# Patient Record
Sex: Female | Born: 1945 | Race: White | Hispanic: No | Smoking: Former smoker
Health system: Southern US, Community
[De-identification: ages and names within clinical notes are randomized; demographics above are authoritative.]

## PROBLEM LIST (undated history)

## (undated) DIAGNOSIS — J45909 Unspecified asthma, uncomplicated: Secondary | ICD-10-CM

## (undated) DIAGNOSIS — E7800 Pure hypercholesterolemia, unspecified: Secondary | ICD-10-CM

## (undated) DIAGNOSIS — I872 Venous insufficiency (chronic) (peripheral): Secondary | ICD-10-CM

## (undated) DIAGNOSIS — R413 Other amnesia: Secondary | ICD-10-CM

## (undated) DIAGNOSIS — R42 Dizziness and giddiness: Secondary | ICD-10-CM

## (undated) DIAGNOSIS — M1990 Unspecified osteoarthritis, unspecified site: Secondary | ICD-10-CM

## (undated) DIAGNOSIS — K635 Polyp of colon: Secondary | ICD-10-CM

## (undated) DIAGNOSIS — F419 Anxiety disorder, unspecified: Secondary | ICD-10-CM

## (undated) DIAGNOSIS — R51 Headache: Secondary | ICD-10-CM

## (undated) DIAGNOSIS — M545 Low back pain, unspecified: Secondary | ICD-10-CM

## (undated) DIAGNOSIS — R55 Syncope and collapse: Secondary | ICD-10-CM

## (undated) DIAGNOSIS — I10 Essential (primary) hypertension: Secondary | ICD-10-CM

## (undated) HISTORY — DX: Essential (primary) hypertension: I10

## (undated) HISTORY — DX: Polyp of colon: K63.5

## (undated) HISTORY — PX: COLONOSCOPY: SHX174

## (undated) HISTORY — DX: Dizziness and giddiness: R42

## (undated) HISTORY — DX: Low back pain, unspecified: M54.50

## (undated) HISTORY — DX: Anxiety disorder, unspecified: F41.9

## (undated) HISTORY — PX: CARPAL TUNNEL RELEASE: SHX101

## (undated) HISTORY — DX: Other amnesia: R41.3

## (undated) HISTORY — DX: Pure hypercholesterolemia, unspecified: E78.00

## (undated) HISTORY — DX: Unspecified asthma, uncomplicated: J45.909

## (undated) HISTORY — DX: Syncope and collapse: R55

## (undated) HISTORY — DX: Headache: R51

## (undated) HISTORY — PX: POLYPECTOMY: SHX149

## (undated) HISTORY — DX: Unspecified osteoarthritis, unspecified site: M19.90

## (undated) HISTORY — DX: Venous insufficiency (chronic) (peripheral): I87.2

## (undated) HISTORY — DX: Morbid (severe) obesity due to excess calories: E66.01

## (undated) HISTORY — DX: Low back pain: M54.5

## (undated) HISTORY — PX: DILATION AND CURETTAGE OF UTERUS: SHX78

---

## 1968-08-01 HISTORY — PX: PELVIC ABCESS DRAINAGE: SHX2189

## 1995-08-02 HISTORY — PX: LUMBAR LAMINECTOMY: SHX95

## 1998-01-14 ENCOUNTER — Ambulatory Visit (HOSPITAL_COMMUNITY): Admission: RE | Admit: 1998-01-14 | Discharge: 1998-01-14 | Payer: Self-pay | Admitting: *Deleted

## 1999-08-24 ENCOUNTER — Encounter: Admission: RE | Admit: 1999-08-24 | Discharge: 1999-11-22 | Payer: Self-pay | Admitting: Pulmonary Disease

## 2000-08-27 ENCOUNTER — Emergency Department (HOSPITAL_COMMUNITY): Admission: EM | Admit: 2000-08-27 | Discharge: 2000-08-27 | Payer: Self-pay | Admitting: Emergency Medicine

## 2003-04-08 ENCOUNTER — Encounter: Payer: Self-pay | Admitting: Emergency Medicine

## 2003-04-08 ENCOUNTER — Emergency Department (HOSPITAL_COMMUNITY): Admission: EM | Admit: 2003-04-08 | Discharge: 2003-04-08 | Payer: Self-pay | Admitting: Emergency Medicine

## 2003-04-29 ENCOUNTER — Ambulatory Visit (HOSPITAL_COMMUNITY): Admission: RE | Admit: 2003-04-29 | Discharge: 2003-04-29 | Payer: Self-pay | Admitting: Cardiology

## 2003-04-29 ENCOUNTER — Encounter: Payer: Self-pay | Admitting: Cardiology

## 2004-03-01 ENCOUNTER — Ambulatory Visit (HOSPITAL_COMMUNITY): Admission: RE | Admit: 2004-03-01 | Discharge: 2004-03-01 | Payer: Self-pay | Admitting: Obstetrics and Gynecology

## 2004-03-01 ENCOUNTER — Encounter (INDEPENDENT_AMBULATORY_CARE_PROVIDER_SITE_OTHER): Payer: Self-pay | Admitting: *Deleted

## 2005-01-25 ENCOUNTER — Ambulatory Visit: Payer: Self-pay | Admitting: Pulmonary Disease

## 2005-07-15 ENCOUNTER — Emergency Department (HOSPITAL_COMMUNITY): Admission: EM | Admit: 2005-07-15 | Discharge: 2005-07-15 | Payer: Self-pay | Admitting: Family Medicine

## 2006-01-23 ENCOUNTER — Ambulatory Visit: Payer: Self-pay | Admitting: Pulmonary Disease

## 2007-01-24 ENCOUNTER — Ambulatory Visit: Payer: Self-pay | Admitting: Pulmonary Disease

## 2007-01-24 LAB — CONVERTED CEMR LAB
Basophils Relative: 0.5 % (ref 0.0–1.0)
CO2: 27 meq/L (ref 19–32)
Creatinine, Ser: 0.7 mg/dL (ref 0.4–1.2)
Direct LDL: 107.6 mg/dL
HCT: 37.9 % (ref 36.0–46.0)
Hemoglobin: 12.8 g/dL (ref 12.0–15.0)
Hgb A1c MFr Bld: 7.4 % — ABNORMAL HIGH (ref 4.6–6.0)
MCHC: 33.8 g/dL (ref 30.0–36.0)
Monocytes Absolute: 0.5 10*3/uL (ref 0.2–0.7)
Neutrophils Relative %: 64.1 % (ref 43.0–77.0)
Potassium: 3.4 meq/L — ABNORMAL LOW (ref 3.5–5.1)
RDW: 14.1 % (ref 11.5–14.6)
Sodium: 135 meq/L (ref 135–145)
Total Bilirubin: 0.8 mg/dL (ref 0.3–1.2)
Total CHOL/HDL Ratio: 3.4
Total Protein: 7.6 g/dL (ref 6.0–8.3)
VLDL: 41 mg/dL — ABNORMAL HIGH (ref 0–40)

## 2008-02-25 ENCOUNTER — Telehealth: Payer: Self-pay | Admitting: Pulmonary Disease

## 2008-02-29 ENCOUNTER — Ambulatory Visit: Payer: Self-pay | Admitting: Pulmonary Disease

## 2008-03-06 DIAGNOSIS — R519 Headache, unspecified: Secondary | ICD-10-CM | POA: Insufficient documentation

## 2008-03-06 DIAGNOSIS — E7800 Pure hypercholesterolemia, unspecified: Secondary | ICD-10-CM

## 2008-03-06 DIAGNOSIS — I10 Essential (primary) hypertension: Secondary | ICD-10-CM | POA: Insufficient documentation

## 2008-03-06 DIAGNOSIS — F411 Generalized anxiety disorder: Secondary | ICD-10-CM

## 2008-03-06 DIAGNOSIS — J209 Acute bronchitis, unspecified: Secondary | ICD-10-CM

## 2008-03-06 DIAGNOSIS — R51 Headache: Secondary | ICD-10-CM

## 2008-03-06 DIAGNOSIS — M545 Low back pain: Secondary | ICD-10-CM | POA: Insufficient documentation

## 2008-03-07 ENCOUNTER — Ambulatory Visit: Payer: Self-pay | Admitting: Pulmonary Disease

## 2008-03-08 LAB — CONVERTED CEMR LAB
ALT: 28 units/L (ref 0–35)
AST: 32 units/L (ref 0–37)
Albumin: 3.9 g/dL (ref 3.5–5.2)
Alkaline Phosphatase: 68 units/L (ref 39–117)
BUN: 12 mg/dL (ref 6–23)
CO2: 27 meq/L (ref 19–32)
Eosinophils Relative: 2.7 % (ref 0.0–5.0)
GFR calc Af Amer: 110 mL/min
Glucose, Bld: 138 mg/dL — ABNORMAL HIGH (ref 70–99)
HCT: 36.5 % (ref 36.0–46.0)
Hemoglobin: 12.7 g/dL (ref 12.0–15.0)
Monocytes Absolute: 0.4 10*3/uL (ref 0.1–1.0)
Monocytes Relative: 7.8 % (ref 3.0–12.0)
Platelets: 204 10*3/uL (ref 150–400)
Potassium: 4.4 meq/L (ref 3.5–5.1)
Total CHOL/HDL Ratio: 3.5
Total Protein: 7.2 g/dL (ref 6.0–8.3)
WBC: 5.1 10*3/uL (ref 4.5–10.5)

## 2008-04-08 ENCOUNTER — Encounter: Payer: Self-pay | Admitting: Pulmonary Disease

## 2008-05-30 ENCOUNTER — Ambulatory Visit: Payer: Self-pay | Admitting: Internal Medicine

## 2008-05-30 ENCOUNTER — Telehealth: Payer: Self-pay | Admitting: Pulmonary Disease

## 2008-06-17 ENCOUNTER — Ambulatory Visit: Payer: Self-pay | Admitting: Internal Medicine

## 2008-06-17 ENCOUNTER — Encounter: Payer: Self-pay | Admitting: Internal Medicine

## 2008-06-18 ENCOUNTER — Encounter: Payer: Self-pay | Admitting: Internal Medicine

## 2008-06-20 ENCOUNTER — Telehealth (INDEPENDENT_AMBULATORY_CARE_PROVIDER_SITE_OTHER): Payer: Self-pay | Admitting: *Deleted

## 2008-09-04 ENCOUNTER — Ambulatory Visit: Payer: Self-pay | Admitting: Pulmonary Disease

## 2009-05-11 ENCOUNTER — Telehealth: Payer: Self-pay | Admitting: Pulmonary Disease

## 2009-05-15 ENCOUNTER — Ambulatory Visit: Payer: Self-pay | Admitting: Pulmonary Disease

## 2009-05-20 ENCOUNTER — Ambulatory Visit: Payer: Self-pay | Admitting: Pulmonary Disease

## 2009-05-21 DIAGNOSIS — M1990 Unspecified osteoarthritis, unspecified site: Secondary | ICD-10-CM

## 2009-05-21 DIAGNOSIS — D126 Benign neoplasm of colon, unspecified: Secondary | ICD-10-CM

## 2009-05-21 LAB — CONVERTED CEMR LAB
ALT: 38 units/L — ABNORMAL HIGH (ref 0–35)
AST: 39 units/L — ABNORMAL HIGH (ref 0–37)
Albumin: 4 g/dL (ref 3.5–5.2)
Alkaline Phosphatase: 60 units/L (ref 39–117)
BUN: 11 mg/dL (ref 6–23)
Basophils Absolute: 0 10*3/uL (ref 0.0–0.1)
Basophils Relative: 0.6 % (ref 0.0–3.0)
Bilirubin, Direct: 0 mg/dL (ref 0.0–0.3)
CO2: 28 meq/L (ref 19–32)
Calcium: 9.3 mg/dL (ref 8.4–10.5)
Chloride: 94 meq/L — ABNORMAL LOW (ref 96–112)
Cholesterol: 178 mg/dL (ref 0–200)
Creatinine, Ser: 0.6 mg/dL (ref 0.4–1.2)
Eosinophils Absolute: 0.1 10*3/uL (ref 0.0–0.7)
Eosinophils Relative: 1.8 % (ref 0.0–5.0)
GFR calc non Af Amer: 107.89 mL/min (ref >60)
Glucose, Bld: 131 mg/dL — ABNORMAL HIGH (ref 70–99)
HCT: 35.6 % — ABNORMAL LOW (ref 36.0–46.0)
HDL: 51 mg/dL (ref >39.00)
Hemoglobin: 12.4 g/dL (ref 12.0–15.0)
Hgb A1c MFr Bld: 6.6 % — ABNORMAL HIGH (ref 4.6–6.5)
LDL Cholesterol: 89 mg/dL (ref 0–99)
Lymphocytes Relative: 27.6 % (ref 12.0–46.0)
Lymphs Abs: 1.5 10*3/uL (ref 0.7–4.0)
MCHC: 34.7 g/dL (ref 30.0–36.0)
MCV: 87.8 fL (ref 78.0–100.0)
Monocytes Absolute: 0.5 10*3/uL (ref 0.1–1.0)
Monocytes Relative: 8.5 % (ref 3.0–12.0)
Neutro Abs: 3.5 10*3/uL (ref 1.4–7.7)
Neutrophils Relative %: 61.5 % (ref 43.0–77.0)
Platelets: 216 10*3/uL (ref 150.0–400.0)
Potassium: 4.1 meq/L (ref 3.5–5.1)
RBC: 4.06 M/uL (ref 3.87–5.11)
RDW: 13.2 % (ref 11.5–14.6)
Sodium: 133 meq/L — ABNORMAL LOW (ref 135–145)
TSH: 0.83 microintl units/mL (ref 0.35–5.50)
Total Bilirubin: 0.7 mg/dL (ref 0.3–1.2)
Total CHOL/HDL Ratio: 3
Total Protein: 7.3 g/dL (ref 6.0–8.3)
Triglycerides: 190 mg/dL — ABNORMAL HIGH (ref 0.0–149.0)
VLDL: 38 mg/dL (ref 0.0–40.0)
WBC: 5.6 10*3/uL (ref 4.5–10.5)

## 2010-05-10 ENCOUNTER — Telehealth (INDEPENDENT_AMBULATORY_CARE_PROVIDER_SITE_OTHER): Payer: Self-pay | Admitting: *Deleted

## 2010-05-17 ENCOUNTER — Ambulatory Visit: Payer: Self-pay | Admitting: Pulmonary Disease

## 2010-05-21 ENCOUNTER — Encounter: Payer: Self-pay | Admitting: Pulmonary Disease

## 2010-05-21 ENCOUNTER — Ambulatory Visit: Payer: Self-pay | Admitting: Pulmonary Disease

## 2010-05-24 DIAGNOSIS — I872 Venous insufficiency (chronic) (peripheral): Secondary | ICD-10-CM

## 2010-05-24 LAB — CONVERTED CEMR LAB
Basophils Absolute: 0 10*3/uL (ref 0.0–0.1)
Bilirubin, Direct: 0 mg/dL (ref 0.0–0.3)
CO2: 27 meq/L (ref 19–32)
Calcium: 9.8 mg/dL (ref 8.4–10.5)
Cholesterol: 197 mg/dL (ref 0–200)
Creatinine, Ser: 0.5 mg/dL (ref 0.4–1.2)
Direct LDL: 119.8 mg/dL
Eosinophils Absolute: 0.1 10*3/uL (ref 0.0–0.7)
Glucose, Bld: 123 mg/dL — ABNORMAL HIGH (ref 70–99)
Lymphocytes Relative: 20.4 % (ref 12.0–46.0)
MCHC: 34.4 g/dL (ref 30.0–36.0)
Neutrophils Relative %: 70.3 % (ref 43.0–77.0)
RBC: 3.84 M/uL — ABNORMAL LOW (ref 3.87–5.11)
RDW: 14.7 % — ABNORMAL HIGH (ref 11.5–14.6)
TSH: 0.52 microintl units/mL (ref 0.35–5.50)
Total Bilirubin: 0.5 mg/dL (ref 0.3–1.2)
Total CHOL/HDL Ratio: 3
Triglycerides: 213 mg/dL — ABNORMAL HIGH (ref 0.0–149.0)
Vit D, 25-Hydroxy: 36 ng/mL (ref 30–89)

## 2010-08-31 NOTE — Progress Notes (Signed)
Summary: fasting labs---order in idx  Phone Note Call from Patient Call back at Work Phone [phone]   Caller: pt Call For: Cynthia [name] Summary of Call: Pt has physical schduled for 05-21-10 at 9am. Pt wants to come by earlier in the week for her fasting labs. Pt request a call back after this has been done. Please advise on what labs to order. Thaks. Carron Curie CMA  May 10, 2010 11:00 AM   Follow-up for Phone Call        per SN---ok for pt to come in for labs----flp,bmp,hepat, cbcd, tsh--v70.0   a1c-250.00 and vit d-733.00.  thanks Randell Loop CMA  May 10, 2010 11:52 AM   called and spoke with pt.  pt aware sn ok'd labs prior to appt. pt would like to come in monday 05/17/2010 for labwork.  labs in idx.  pt aware.  Aundra Millet Reynolds LPN  May 10, 2010 1:10 PM

## 2010-08-31 NOTE — Assessment & Plan Note (Signed)
Summary: 1 yr physical ///kp   CC:  Yearly ROV & CPX....  History of Present Illness: 65 y/o WF here for a follow up visit... she has multiple medical problems as noted below...    ~  May 20, 2009:  63 WF here for preventive health visit- hx HBP, Chol, DM, Obesity... weight up 11# this yr to 248#, 5'2" tall, BMI= 45... not motivated to diet/ exercise/ etc & we discussed this in detail... she has no new complaints or concerns, requesting refill meds for 90d supplies...   ~  May 21, 2010:  she's had a good yr- no new complaints or concerns... unfortunately Safiatou has not lost any weight over the past yr & not really dieting or exercising... BP controlled on meds;  Chol looks good on Simva20;  DM remains controlled on oral agents;  we discussed diet + exercise needed to effect weight reduction... she requests 90d refills + flu shot...    Current Problem List:  ASTHMATIC BRONCHITIS, ACUTE (ICD-466.0) - no recent exas...  HYPERTENSION (ICD-401.9) - on ASA 81mg /d,  LISINOPRIL 20mg /d,  HCTZ 25mg /d,  & K20/d... BP= 136/72 today and really not checking at home... denies HA, visual changes, CP, palipit, dizziness, syncope, edema... no change in chr DOE (poor conditioning).  ~  baseline EKG w/ poor R progression & NSSTTWA...  ~  Cath in 9/04 was normal- norm coronaries, norm LVF, etc... rec for aggressive primary prevention.  VENOUS INSUFFICIENCY (ICD-459.81) - she has mild VI & 1+edema... on low sodium diet, elevation, support hose...  HYPERCHOLESTEROLEMIA (ICD-272.0) - on diet + SIMVASTATIN 20mg /d...  ~  FLP 6/08 showed TChol 193, TG 205, HDL 56, LDL 108...   ~  FLP 7/09 showed TChol 176, TG 161, HDL 50, LDL 94... keep same med, better diet effort...  ~  FLP 10/10 showed TChol 178, TG 190, HDL 51, LDL 89  ~  FLP 10/11 showed TChol 197, TG 213, HDL 58, LDL 43  DIABETES MELLITUS (ICD-250.00) - on diet + METFORMIN 500mg Bid,  ACTOS 15mg /d (30mg tabs- 1/2)...   ~  labs 6/09 showed BS= 144,  HgA1c= 7.4... she was on Metformin once daily- incr to Bid...  ~  labs 7/09 showed BS= 138, HgA1c= 6.4.Marland KitchenMarland Kitchen rec better diet & get weight down...  ~  labs 10/10 showed BS= 131, A1c= 6.6.Marland Kitchen. rec> same meds, better diet, get wt down!  ~  labs 10/11 showed BS= 123, A1c= 6.6  MORBID OBESITY (ICD-278.01) -   ~  8/09: weight = 237# which is down 6# since 2008... discussed diet + exercise...  ~  10/10: weight = 248#... reviewed diet + exercise perscription for wt reduction...  ~  10/11:  weight = 248#  COLONIC POLYPS (ICD-211.3) - pt finally had routine screening colonoscopy 11/09 w/ 3 adenomatous polyps removed by DrDBrodie... f/u planned 69yrs...  BACK PAIN, LUMBAR (ICD-724.2) - s/o lumbar lam in 1977...  HEADACHE (ICD-784.0) - hx dizziness secondary to hyperventilation in past w/ eval from Neurology...  ANXIETY DISORDER (ICD-300.00) - on ALPRAZOLAM 0.5mg  Prn...   Preventive Screening-Counseling & Management  Alcohol-Tobacco     Smoking Status: quit     Year Quit: 1980  Allergies (verified): No Known Drug Allergies  Comments:  Nurse/Medical Assistant: The patient's medications and allergies were reviewed with the patient and were updated in the Medication and Allergy Lists.  Past History:  Past Medical History: ASTHMATIC BRONCHITIS, ACUTE (ICD-466.0) HYPERTENSION (ICD-401.9) VENOUS INSUFFICIENCY (ICD-459.81) HYPERCHOLESTEROLEMIA (ICD-272.0) DIABETES MELLITUS (ICD-250.00) MORBID OBESITY (ICD-278.01) COLONIC POLYPS (  ICD-211.3) DEGENERATIVE JOINT DISEASE (ICD-715.90) BACK PAIN, LUMBAR (ICD-724.2) HEADACHE (ICD-784.0) ANXIETY DISORDER (ICD-300.00)  Past Surgical History: S/P drainage of pelvic abscess by DrMLeBauer in 1970 S/P lumbar laminectomy 1977 by DrRobinson  Family History: Reviewed history from 03/07/2008 and no changes required. mother deceased age 93 from heart problems father deceased age 27 from heart problems 1 sibling alive age 39  hx of heart bypass and DM 1  sibling alive age 57 hx of DM  Social History: Reviewed history from 05/20/2009 and no changes required. 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  The patient denies fever, chills, sweats, anorexia, weakness, malaise, weight loss, sleep disorder, blurring, diplopia, eye irritation, eye discharge, vision loss, eye pain, photophobia, earache, ear discharge, tinnitus, decreased hearing, nasal congestion, nosebleeds, sore throat, hoarseness, chest pain, palpitations, syncope, orthopnea, PND, peripheral edema, cough, dyspnea at rest, excessive sputum, hemoptysis, wheezing, pleurisy, nausea, vomiting, diarrhea, constipation, change in bowel habits, abdominal pain, melena, hematochezia, jaundice, gas/bloating, indigestion/heartburn, dysphagia, odynophagia, dysuria, hematuria, urinary frequency, urinary hesitancy, nocturia, incontinence, back pain, joint pain, joint swelling, muscle cramps, muscle weakness, sciatica, restless legs, leg pain at night, leg pain with exertion, rash, itching, dryness, suspicious lesions, paralysis, paresthesias, seizures, tremors, vertigo, transient blindness, frequent falls, frequent headaches, difficulty walking, depression, anxiety, memory loss, confusion, cold intolerance, heat intolerance, polydipsia, polyphagia, polyuria, unusual weight change, abnormal bruising, bleeding, enlarged lymph nodes, urticaria, allergic rash, hay fever, and recurrent infections.    Vital Signs:  Patient profile:   65 year old female Height:      62 inches Weight:      247.50 pounds BMI:     45.43 O2 Sat:      97 % on Room air Temp:     97.9 degrees F oral Pulse rate:   87 / minute BP sitting:   136 / 72  (right arm) Cuff size:   large  Vitals Entered By: Randell Loop CMA  (May 21, 2010 8:52 AM)  O2 Sat at Rest %:  97 O2 Flow:  Room air CC: Yearly ROV & CPX... Is Patient Diabetic? Yes Pain Assessment Patient in pain? no      Comments NO CHANGES IN MEDS TODAY   Physical Exam  Additional Exam:  WD, Obese, 65 y/o WF in NAD... GENERAL:  Alert & oriented; pleasant & cooperative... HEENT:  [place]/AT, EOM-wnl, PERRLA, EACs-clear, TMs-wnl, NOSE-clear, THROAT-clear & wnl. NECK:  Supple w/ fairROM; no JVD; normal carotid impulses w/o bruits; no thyromegaly or nodules palpated; no lymphadenopathy. CHEST:  Clear to P & A; without wheezes/ rales/ or rhonchi. HEART:  Regular Rhythm; without murmurs/ rubs/ or gallops. ABDOMEN:  Obese, soft & nontender w/ panniculus; normal bowel sounds; no organomegaly or masses detected.  EXT: without deformities, mild arthritic changes; no varicose veins/ +venous insuffic/ tr edema... NEURO:  CN's intact;  no focal neuro deficits... DERM:  no rash, no lesions noted...    CXR  Procedure date:  05/21/2010  Findings:      CHEST - 2 VIEW Comparison: None.   Findings: Two views of the chest were obtained.  Lungs clear without focal airspace disease or pulmonary edema.  Heart and mediastinum are within normal limits.  Trachea is midline.  Osseous structures are intact.   IMPRESSION: Normal chest examination.  No acute findings.   Read By:  Abundio Miu,  M.D.   EKG  Procedure date:  05/21/2010  Findings:      Normal sinus rhythm with rate of:  90/ min... Tracing shows NSSTTWA... SN   MISC. Report  Procedure date:  05/17/2010  Findings:      Lipid Panel (LIPID)   Cholesterol               197 mg/dL                   1-610   Triglycerides        [H]  213.0 mg/dL                 9.6-045.4   HDL                       09.81 mg/dL                 >19.14  Cholesterol LDL - Direct                             119.8 mg/dL  BMP (METABOL)   Sodium               [L]  133 mEq/L                   135-145    Potassium                 4.7 mEq/L                   3.5-5.1   Chloride             [L]  95 mEq/L                    96-112   Carbon Dioxide            27 mEq/L                    19-32   Glucose              [H]  123 mg/dL                   78-29   BUN                       14 mg/dL                    5-62   Creatinine                0.5 mg/dL                   1.3-0.8   Calcium                   9.8 mg/dL                   6.5-78.4   GFR                       129.72 mL/min               >60   CBC Platelet w/Diff (CBCD)   White Cell Count          7.0 K/uL  4.5-10.5   Red Cell Count       [L]  3.84 Mil/uL                 3.87-5.11   Hemoglobin           [L]  11.7 g/dL                   16.1-09.6   Hematocrit           [L]  34.2 %                      36.0-46.0   MCV                       88.9 fl                     78.0-100.0  Platelet Count            203.0 K/uL                  150.0-400.0   Neutrophil %              70.3 %                      43.0-77.0   Lymphocyte %              20.4 %                      12.0-46.0   Monocyte %                7.3 %                       3.0-12.0   Eosinophils%              1.6 %                       0.0-5.0   Basophils %               0.4 %    Comments:      TSH (TSH)   FastTSH                   0.52 uIU/mL                 0.35-5.50  Hepatic/Liver Function Panel (HEPATIC)   Total Bilirubin           0.5 mg/dL                   0.4-5.4   Direct Bilirubin          0.0 mg/dL                   0.9-8.1   Alkaline Phosphatase      55 U/L                      39-117   AST                       33 U/L                      0-37   ALT  27 U/L                      0-35   Total Protein             6.7 g/dL                    0.9-8.1   Albumin                   4.0 g/dL                    1.9-1.4  Hemoglobin A1C (A1C)   Hemoglobin A1C       [H]  6.6 %                       4.6-6.5   Impression &  Recommendations:  Problem # 1:  PHYSICAL EXAMINATION (ICD-V70.0) Weight reduction is of the utmost importance... Orders: T-2 View CXR (71020TC) 12 Lead EKG (12 Lead EKG)  Problem # 2:  HYPERTENSION (ICD-401.9) Controlled>  same meds. Her updated medication list for this problem includes:    Lisinopril 20 Mg Tabs (Lisinopril) .Marland Kitchen... Take 1 tab by mouth once daily...    Hydrochlorothiazide 25 Mg Tabs (Hydrochlorothiazide) .Marland Kitchen... Take 1 tablet by mouth once a day  Problem # 3:  VENOUS INSUFFICIENCY (ICD-459.81) No salt, elevation, support hose, etc...  Problem # 4:  HYPERCHOLESTEROLEMIA (ICD-272.0) FLP not as good this yr on her Simva20... may need to go to 40mg /d but she really needs to lose wt! Her updated medication list for this problem includes:    Simvastatin 20 Mg Tabs (Simvastatin) .Marland Kitchen... Take 1 tab by mouth at bedtime...  Problem # 5:  DIABETES MELLITUS (ICD-250.00) DM control is adeq... diet + exercise are imperitive... Her updated medication list for this problem includes:    Adult Aspirin Low Strength 81 Mg Tbdp (Aspirin) .Marland Kitchen... Take 1 tablet by mouth once a day    Lisinopril 20 Mg Tabs (Lisinopril) .Marland Kitchen... Take 1 tab by mouth once daily...    Metformin Hcl 500 Mg Tabs (Metformin hcl) .Marland Kitchen... Take 1 tab by mouth two times a day    Actos 30 Mg Tabs (Pioglitazone hcl) .Marland Kitchen... Take as directed...  Problem # 6:  MORBID OBESITY (ICD-278.01) We reviewed diet + exercise strategies that should aide weight reduction...  Problem # 7:  OTHER MEDICAL PROBLEMS AS NOTED>>> OK Flu shot...  Complete Medication List: 1)  Adult Aspirin Low Strength 81 Mg Tbdp (Aspirin) .... Take 1 tablet by mouth once a day 2)  Lisinopril 20 Mg Tabs (Lisinopril) .... Take 1 tab by mouth once daily.Marland KitchenMarland Kitchen 3)  Hydrochlorothiazide 25 Mg Tabs (Hydrochlorothiazide) .... Take 1 tablet by mouth once a day 4)  Klor-con M20 20 Meq Cr-tabs (Potassium chloride crys cr) .... Take 1 tablet by mouth once a day 5)  Simvastatin 20  Mg Tabs (Simvastatin) .... Take 1 tab by mouth at bedtime.Marland KitchenMarland Kitchen 6)  Metformin Hcl 500 Mg Tabs (Metformin hcl) .... Take 1 tab by mouth two times a day 7)  Actos 30 Mg Tabs (Pioglitazone hcl) .... Take as directed... 8)  Alprazolam 0.5 Mg Tabs (Alprazolam) .... Take 1/2 to 1 tab by mouth three times a day as needed for nerves.Marland KitchenMarland Kitchen 9)  Womens Multivitamin Plus Tabs (Multiple vitamins-minerals) .... Take 1 tab by mouth once daily... 10)  Ferrous Sulfate 325 (65 Fe) Mg Tabs (Ferrous sulfate) .... Take 1 tab by mouth  once daily...  Other Orders: Admin 1st Vaccine (95188) Flu Vaccine 63yrs + [phone])  Patient Instructions: 1)  Today we updated your med list- see below.... 2)  We refilled your meds for 90d supplies.Marland KitchenMarland Kitchen 3)  We also discussed taking a Women's Multivit w/ at least 1000mg  Vit D in it; plus an iron supplement like Ferrous Sulfate or Feosol daily.Marland KitchenMarland Kitchen 4)  Today we did your follow up CXR & EKG- please call the "phone tree" in a few days for your results.Marland KitchenMarland Kitchen  5)  We reviewed your labs & they are stable... let's get on track w/ our diet & exercise program w/ a goal of losing 15-20 lbs over the next few months... 6)  We gave you the 2011 Flu vaccine today... 7)  Call for any questions... Prescriptions: ALPRAZOLAM 0.5 MG  TABS (ALPRAZOLAM) take 1/2 to 1 tab by mouth three times a day as needed for nerves...  #100 x 6   Entered and Authorized by:   Michele Mcalpine MD   Signed by:   Michele Mcalpine MD on 05/21/2010   Method used:   Print then Give to Patient   RxID:   [id number] ACTOS 30 MG TABS (PIOGLITAZONE HCL) take as directed...  #90 x 4   Entered and Authorized by:   Michele Mcalpine MD   Signed by:   Michele Mcalpine MD on 05/21/2010   Method used:   Print then Give to Patient   RxID:   [id number] METFORMIN HCL 500 MG  TABS (METFORMIN HCL) take 1 tab by mouth two times a day  #180 x 4   Entered and Authorized by:   Michele Mcalpine MD   Signed by:   Michele Mcalpine MD on 05/21/2010   Method  used:   Print then Give to Patient   RxID:   [id number] SIMVASTATIN 20 MG  TABS (SIMVASTATIN) take 1 tab by mouth at bedtime...  #90 x 4   Entered and Authorized by:   Michele Mcalpine MD   Signed by:   Michele Mcalpine MD on 05/21/2010   Method used:   Print then Give to Patient   RxID:   [id number] KLOR-CON M20 20 MEQ  CR-TABS (POTASSIUM CHLORIDE CRYS CR) Take 1 tablet by mouth once a day  #90 x 4   Entered and Authorized by:   Michele Mcalpine MD   Signed by:   Michele Mcalpine MD on 05/21/2010   Method used:   Print then Give to Patient   RxID:   [id number] HYDROCHLOROTHIAZIDE 25 MG  TABS (HYDROCHLOROTHIAZIDE) Take 1 tablet by mouth once a day  #90 x 4   Entered and Authorized by:   Michele Mcalpine MD   Signed by:   Michele Mcalpine MD on 05/21/2010   Method used:   Print then Give to Patient   RxID:   [id number] LISINOPRIL 20 MG  TABS (LISINOPRIL) take 1 tab by mouth once daily...  #90 x 4   Entered and Authorized by:   Michele Mcalpine MD   Signed by:   Michele Mcalpine MD on 05/21/2010   Method used:   Print then Give to Patient   RxID:   [id number]    Immunization History:  Influenza Immunization History:    Influenza:  historical (05/20/2009) Flu Vaccine Consent Questions     Do you have a history of severe allergic reactions to this vaccine? no    Any prior history  of allergic reactions to egg and/or gelatin? no    Do you have a sensitivity to the preservative Thimersol? no    Do you have a past history of Guillan-Barre Syndrome? no    Do you currently have an acute febrile illness? no    Have you ever had a severe reaction to latex? no    Vaccine information given and explained to patient? yes    Are you currently pregnant? no    Lot Number:[id number]   Exp Date:01/29/2011   Site Given  Left Deltoid [id number]   Randell Loop CMA  May 21, 2010 11:00 AM

## 2010-12-17 NOTE — Cardiovascular Report (Signed)
NAME:  Cynthia [name], Cynthia [name] NO.:  [id number]   MEDICAL RECORD NO.:  [id number]                   PATIENT TYPE:  OIB   LOCATION:  2867                                 FACILITY:  [facility]   PHYSICIAN:  Carole Binning, M.D. [facility]         DATE OF BIRTH:  Sep 25, 1945   DATE OF PROCEDURE:  04/29/2003  DATE OF DISCHARGE:  04/29/2003                              CARDIAC CATHETERIZATION   PROCEDURE PERFORMED:  Right and left heart catheterization with coronary  angiography and left ventriculography.   INDICATIONS:  [name] is a 65 year old woman with multiple cardiac risk  factors.  She has been having progressive exertional dyspnea and occasional  chest discomfort.  She was evaluated by Dr. Tenny Craw and referred for right and  left heart catheterization to evaluate her hemodynamics and rule out  coronary artery disease.   PROCEDURAL NOTE:  An 8 French sheath was placed in the right femoral vein, a  6 French sheath in the right femoral artery.  Right heart catheterization  was performed with a Swan-Ganz catheter.  Left ventriculography was  performed with an angled pigtail catheter.  Coronary angiography was  performed with 6 [place] JL4 and JR4 catheters.  Contrast with Omnipaque.  There were no complications.   RESULTS:   HEMODYNAMICS:  1. Right atrial mean pressure:  11.  2. Right ventricular pressure:  32/10.  3. Pulmonary artery pressure:  32/16.  4. Pulmonary capillary wedge mean pressure:  13.  5. Left ventricular pressure:  104/16.  6. Aortic pressure:  102/56.  7. There was no aortic valve gradient.  8. Cardiac output by the thermodilution method is 5.7, cardiac index 2.7.     Cardiac output measured by the Fick method is 3.7, cardiac index 1.8.     The thermodilution cardiac output appears to be the more accurate of the     two.   LEFT VENTRICULOGRAM:  Wall motion is normal.  Ejection fraction estimated at  greater than or equal to 65%.  There is no  mitral regurgitation.   CORONARY ARTERIOGRAPHY (RIGHT-DOMINANT):  1. Left main is very short but free of disease.  2. Left anterior descending artery gives rise to a small first diagonal and     normal-sized second and third diagonal branches.  The LAD is normal.  3. Left circumflex gives rise to a single large branching obtuse marginal.     The left circumflex is normal.  4. Right coronary artery was a dominant vessel.  There was catheter-induced     spasm in the proximal right coronary artery, which was relieved with     intracoronary nitroglycerin.  The distal right coronary artery gives rise     to a normal-sized posterior descending artery and two small to normal-     sized posterolateral branches.   IMPRESSION:  1. Normal right and left heart filling pressures with normal pulmonary  artery pressure.  2. Normal left ventricular systolic function.  3. Normal coronary arteries.   PLAN:  The patient will be managed medically.  It is recommended that  alternative etiologies for her symptoms be evaluated.                                                Carole Binning, M.D. [facility]    MWP/MEDQ  D:  04/29/2003  T:  04/30/2003  Job:  [id number]   cc:   Lonzo Cloud. Kriste Basque, M.D. [facility]   Pricilla Riffle, M.D.   Cardiac Catheterization Lab

## 2010-12-17 NOTE — H&P (Signed)
NAME:  Cynthia [name], Cynthia [name] NO.:  [id number]   MEDICAL RECORD NO.:  [id number]                   PATIENT TYPE:   LOCATION:                                       FACILITY:   PHYSICIAN:  Rudy Jew. Ashley Royalty, M.D.             DATE OF BIRTH:   DATE OF ADMISSION:  03/01/2004  DATE OF DISCHARGE:                                HISTORY & PHYSICAL   HISTORY:  A 65 year old nulla gravida who presented to me on February 06, 2004  for her annual examination.  At the time of the visit, she was unable to  tolerate a pelvic examination due to inability to tolerate having the  speculum opened in her vagina.  Hence she is brought in for examination  under anesthesia with pap smear and possible dilatation and curettage.  She  is some 7 years post menopausal.  She has occasional hot flashes but no  vaginal dryness.  She has had no pap or pelvic examination in several years.   MEDICATIONS:  1. Zestril.  2. Glucophage XR.  3. Actos.  4. Lipitor.  5. Hydrochlorothiazide.  6. Alprazolam.   PAST MEDICAL HISTORY:  1. Diabetes.  2. Hypertension.  3. Hypercholesterolemia.   PAST SURGICAL HISTORY:  1. EAVW-0981.  2. Dilatation and curretage-1988 and 1990.   ALLERGIES:  None.   FAMILY HISTORY:  Positive for hypertension and diabetes.   SOCIAL HISTORY:  She denies tobacco or significant alcohol.   REVIEW OF SYSTEMS:  Noncontributory.   PHYSICAL EXAMINATION:  GENERAL:  A well-developed well-nourished pleasant  female in acute distress.  VITAL SIGNS:  Stable.  SKIN:  Warm and dry with no lesions.  LYMPH NODES:  There is no supraclavicular, cervical, or inguinal adenopathy.  HEENT:  Normocephalic.  NECK:  Supple without thyromegaly.  LUNGS:  Clear.  CARDIAC:  Regular rate and rhythm without murmurs, rubs, or gallops.  BREASTS:  Without palpable masses, discharge, or axillary adenopathy.  ABDOMEN:  Soft, nontender, without masses or organomegaly.  Bowel sounds are   adequate.  MUSCULOSKELETAL:  Reveals full range of motion without edema, cyanosis or  CVA tenderness.  PELVIC:  External genitalia within normal limits.  Speculum examination was  not tolerated as mentioned above.   IMPRESSION:  1. Climacteric.  2. Obesity.  3. Hypertension.  4. Hypercholesterolemia.  5. Diabetes mellitus.  6. Inability to tolerate the pelvic examination in office setting.   PLAN:  Examination under anesthesia with pap smear, pelvic examination, and  possible dilatation and curettage.  The risks and benefits of the above were  discussed with the patient.  She states she understands and accepts.  All  questions were answered.  James A. Ashley Royalty, M.D.    JAM/MEDQ  D:  03/01/2004  T:  03/01/2004  Job:  [id number]

## 2010-12-17 NOTE — Op Note (Signed)
NAME:  Cynthia [name], Cynthia [name]                            ACCOUNT NO.:  [id number]   MEDICAL RECORD NO.:  [id number]                   PATIENT TYPE:  AMB   LOCATION:  SDC                                  FACILITY:  [facility]   PHYSICIAN:  James A. Ashley Royalty, M.D.             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   OPERATION PERFORMED:  1. Examination under anesthesia.  2. Pap smear.   SURGEON:  Rudy Jew. Ashley Royalty, M.D.   ANESTHESIA:  Monitored anesthesia care with paracervical block (1%  Xylocaine).   ESTIMATED BLOOD LOSS:  Less than 10 mL.   COMPLICATIONS:  None.   PACKS AND DRAINS:  None.   DESCRIPTION OF PROCEDURE:  The patient was taken to the operating room and  placed in the dorsal supine position.  IV sedation was administered and  speculum was placed per vagina.  The patient required additional anesthetic  agent in order to allow placement of the speculum.  She was still quite  uncomfortable and the decision was made to proceed with a paracervical  block.  The cervix was cleansed with Betadine and approximately 10 mL of  Xylocaine were instilled into the anterior lip in order to grasp it with a  tenaculum successfully.  The cervix was then pulled into view with gentle  traction on the tenaculum.  Pap smear was successfully performed including  an endocervical and ectocervical component.  At this point the vaginal  instruments were removed.   Bimanual examination was then performed.  The uterus was felt to be normal  size, shape and contour.  It appeared to be midposition.  I could appreciate  no adnexal masses.  Rectovaginal examination confirmed.  The examination was  compromised slightly by  the patient's obesity but no abnormalities were  felt.   Hemostasis was noted and the patient was taken to recovery room in excellent  condition.                                               James A. Ashley Royalty, M.D.    JAM/MEDQ  D:  03/01/2004  T:  03/01/2004  Job:  [id number]

## 2011-05-03 LAB — GLUCOSE, CAPILLARY: Glucose-Capillary: 120 — ABNORMAL HIGH

## 2011-05-16 ENCOUNTER — Telehealth: Payer: Self-pay | Admitting: Pulmonary Disease

## 2011-05-16 DIAGNOSIS — E119 Type 2 diabetes mellitus without complications: Secondary | ICD-10-CM

## 2011-05-16 DIAGNOSIS — E7800 Pure hypercholesterolemia, unspecified: Secondary | ICD-10-CM

## 2011-05-16 DIAGNOSIS — M1990 Unspecified osteoarthritis, unspecified site: Secondary | ICD-10-CM

## 2011-05-16 DIAGNOSIS — I10 Essential (primary) hypertension: Secondary | ICD-10-CM

## 2011-05-16 NOTE — Telephone Encounter (Signed)
Per SN-okay to place lab order for Lipid, CBC-diff, BMET, Hep panel, TSH, Vit D, HgA1C. Pt is aware that labs are ordered.

## 2011-05-16 NOTE — Telephone Encounter (Signed)
IPt is requesting to have labs done before her 10/25 apt. Please advise what labs pt will need Dr. Kriste Basque, thanks  Carver Fila, CMA

## 2011-05-19 ENCOUNTER — Other Ambulatory Visit: Payer: Self-pay | Admitting: Pulmonary Disease

## 2011-05-19 ENCOUNTER — Other Ambulatory Visit (INDEPENDENT_AMBULATORY_CARE_PROVIDER_SITE_OTHER): Payer: Self-pay

## 2011-05-19 DIAGNOSIS — M1990 Unspecified osteoarthritis, unspecified site: Secondary | ICD-10-CM

## 2011-05-19 DIAGNOSIS — E119 Type 2 diabetes mellitus without complications: Secondary | ICD-10-CM

## 2011-05-19 DIAGNOSIS — I10 Essential (primary) hypertension: Secondary | ICD-10-CM

## 2011-05-19 DIAGNOSIS — E7800 Pure hypercholesterolemia, unspecified: Secondary | ICD-10-CM

## 2011-05-19 LAB — BASIC METABOLIC PANEL
BUN: 12 mg/dL (ref 6–23)
CO2: 28 mEq/L (ref 19–32)
Calcium: 9.5 mg/dL (ref 8.4–10.5)
Creatinine, Ser: 0.6 mg/dL (ref 0.4–1.2)

## 2011-05-19 LAB — CBC WITH DIFFERENTIAL/PLATELET
Basophils Absolute: 0 10*3/uL (ref 0.0–0.1)
Eosinophils Absolute: 0.1 10*3/uL (ref 0.0–0.7)
HCT: 37.7 % (ref 36.0–46.0)
Hemoglobin: 12.8 g/dL (ref 12.0–15.0)
Lymphs Abs: 1.1 10*3/uL (ref 0.7–4.0)
MCHC: 33.9 g/dL (ref 30.0–36.0)
Monocytes Relative: 8 % (ref 3.0–12.0)
Neutro Abs: 4.3 10*3/uL (ref 1.4–7.7)
RDW: 14.8 % — ABNORMAL HIGH (ref 11.5–14.6)

## 2011-05-19 LAB — HEPATIC FUNCTION PANEL
Bilirubin, Direct: 0.1 mg/dL (ref 0.0–0.3)
Total Protein: 7.4 g/dL (ref 6.0–8.3)

## 2011-05-19 LAB — LIPID PANEL: Cholesterol: 186 mg/dL (ref 0–200)

## 2011-05-19 LAB — HEMOGLOBIN A1C: Hgb A1c MFr Bld: 6.8 % — ABNORMAL HIGH (ref 4.6–6.5)

## 2011-05-19 LAB — TSH: TSH: 0.72 u[IU]/mL (ref 0.35–5.50)

## 2011-05-19 LAB — VITAMIN D 25 HYDROXY (VIT D DEFICIENCY, FRACTURES): Vit D, 25-Hydroxy: 36 ng/mL (ref 30–89)

## 2011-05-25 ENCOUNTER — Encounter: Payer: Self-pay | Admitting: Pulmonary Disease

## 2011-05-26 ENCOUNTER — Encounter: Payer: Self-pay | Admitting: Pulmonary Disease

## 2011-05-26 ENCOUNTER — Ambulatory Visit (INDEPENDENT_AMBULATORY_CARE_PROVIDER_SITE_OTHER)
Admission: RE | Admit: 2011-05-26 | Discharge: 2011-05-26 | Disposition: A | Discharge status: Home / Self Care | Payer: Managed Care, Other (non HMO) | Source: Ambulatory Visit | Attending: Pulmonary Disease | Admitting: Pulmonary Disease

## 2011-05-26 ENCOUNTER — Ambulatory Visit (INDEPENDENT_AMBULATORY_CARE_PROVIDER_SITE_OTHER): Payer: Managed Care, Other (non HMO) | Admitting: Pulmonary Disease

## 2011-05-26 VITALS — BP 142/62 | HR 82 | Temp 97.1°F | Ht 62.0 in | Wt 243.6 lb

## 2011-05-26 DIAGNOSIS — D126 Benign neoplasm of colon, unspecified: Secondary | ICD-10-CM

## 2011-05-26 DIAGNOSIS — I10 Essential (primary) hypertension: Secondary | ICD-10-CM

## 2011-05-26 DIAGNOSIS — E663 Overweight: Secondary | ICD-10-CM

## 2011-05-26 DIAGNOSIS — Z0000 Encounter for general adult medical examination without abnormal findings: Secondary | ICD-10-CM

## 2011-05-26 DIAGNOSIS — I872 Venous insufficiency (chronic) (peripheral): Secondary | ICD-10-CM

## 2011-05-26 DIAGNOSIS — M545 Low back pain: Secondary | ICD-10-CM

## 2011-05-26 DIAGNOSIS — Z23 Encounter for immunization: Secondary | ICD-10-CM

## 2011-05-26 DIAGNOSIS — E7800 Pure hypercholesterolemia, unspecified: Secondary | ICD-10-CM

## 2011-05-26 DIAGNOSIS — F411 Generalized anxiety disorder: Secondary | ICD-10-CM

## 2011-05-26 DIAGNOSIS — E119 Type 2 diabetes mellitus without complications: Secondary | ICD-10-CM

## 2011-05-26 MED ORDER — LISINOPRIL 20 MG PO TABS: 20.0000 mg | ORAL_TABLET | Freq: Every day | ORAL | Status: DC

## 2011-05-26 MED ORDER — SIMVASTATIN 20 MG PO TABS: 20.0000 mg | ORAL_TABLET | Freq: Every day | ORAL | Status: DC

## 2011-05-26 MED ORDER — METFORMIN HCL 500 MG PO TABS: 500.0000 mg | ORAL_TABLET | Freq: Two times a day (BID) | ORAL | Status: DC

## 2011-05-26 MED ORDER — POTASSIUM CHLORIDE CRYS ER 20 MEQ PO TBCR: 20.0000 meq | EXTENDED_RELEASE_TABLET | Freq: Every day | ORAL | Status: DC

## 2011-05-26 MED ORDER — HYDROCHLOROTHIAZIDE 25 MG PO TABS: 25.0000 mg | ORAL_TABLET | Freq: Every day | ORAL | Status: DC

## 2011-05-26 MED ORDER — ALPRAZOLAM 0.5 MG PO TABS: 0.5000 mg | ORAL_TABLET | Freq: Every evening | ORAL | Status: DC | PRN

## 2011-05-26 MED ORDER — GLIMEPIRIDE 1 MG PO TABS: ORAL_TABLET | ORAL | Status: DC

## 2011-05-26 NOTE — Progress Notes (Signed)
Subjective:    Patient ID: Cynthia [name], female    DOB: 05-05-1946, 65 y.o.   MRN: [id number]  HPI 65 y/o WF here for a follow up visit... she has multiple medical problems as noted below...   ~  May 20, 2009:  65 WF here for preventive health visit- hx HBP, Chol, DM, Obesity... weight up 11# this yr to 248#, 5'2" tall, BMI= 45... not motivated to diet/ exercise/ etc & we discussed this in detail... she has no new complaints or concerns, requesting refill meds for 90d supplies...  ~  May 21, 2010:  she's had a good yr- no new complaints or concerns... unfortunately Ilayda has not lost any weight over the past yr & not really dieting or exercising... BP controlled on meds;  Chol looks good on Simva20;  DM remains controlled on oral agents;  we discussed diet + exercise needed to effect weight reduction... she requests 90d refills + flu shot...  ~  May 26, 2011:  Yearly ROV & review> she has been under considerable stress & spent much of the past yr caring for her cousin Ami Mally (passes away 2023/05/11)...    HBP> on Lisinopril20, HCT25, K20; BP= 142/62 & she notes occas dizzy, some DOE & edema; otherw denies CP/ palpit/ SOB/ etc...    Ven Insuffic> on low sodium diet + the diuretic; reminded of elevation & support hose too...    Chol> on Simva20; Chol numbers look good but TGs are elev & we reviewed low fat diet, exercise, & wt reduction program...    DM> on Metform500Bid & Actos15 but she refuses the Actos due to cost; BS=157, A1c=6.8; we discussed change Actos to Glimepiride1mg - start 1/2 tab Qam...    Obesity> wt=244# which is down 4# in the last yr; she is 63" tall for a BMI=44-45; we reviewed the necessary diet & exercise program for substantial wt reduction...    Anxiety> on Alprazolam0.5mg  prn...          Problem List:  ASTHMATIC BRONCHITIS, ACUTE (ICD-466.0) - no recent exas...  HYPERTENSION (ICD-401.9) - on ASA 81mg /d,  LISINOPRIL 20mg /d,  HCTZ 25mg /d,  & K20/d... ~  baseline  EKG w/ poor R progression & NSSTTWA... ~  Cath in 9/04 was normal- norm coronaries, norm LVF, etc... rec for aggressive primary prevention. ~  10/12:  CXR showed normal heart size, clear lungs but sl low lung vol's;  EKG showed NSR, ?poor R progression, NAD...  VENOUS INSUFFICIENCY (ICD-459.81) - she has mild VI & 1+edema... on low sodium diet, elevation, support hose...  HYPERCHOLESTEROLEMIA (ICD-272.0) - on diet + SIMVASTATIN 20mg /d... ~  FLP 6/08 on Simva20 showed TChol 193, TG 205, HDL 56, LDL 108...  ~  FLP 7/09 on Simva20 showed TChol 176, TG 161, HDL 50, LDL 94... keep same med, better diet effort. ~  FLP 10/10 on Simva20 showed TChol 178, TG 190, HDL 51, LDL 89 ~  FLP 10/11 on Simva20 showed TChol 197, TG 213, HDL 58, LDL 120 ~  FLP 10/12 on Simva20 showed TChol 186, TG 292, HDL 59, LDL 101... Needs better low fat diet & wt reduction.  DIABETES MELLITUS (ICD-250.00) - on diet + METFORMIN 500mg Bid,  ACTOS 15mg /d (30mg tabs- 1/2)...  ~  labs 6/09 showed BS= 144, HgA1c= 7.4... she was on Metformin once daily- incr to Bid... ~  labs 7/09 showed BS= 138, HgA1c= 6.4.Marland KitchenMarland Kitchen rec better diet & get weight down... ~  labs 10/10 on Metform500Bid+Actos15 showed BS= 131, A1c= 6.6.Marland KitchenMarland Kitchen  rec> same meds, better diet, get wt down! ~  labs 10/11 on Metform500Bid+Actos15 showed BS= 123, A1c= 6.6 ~  Labs 10/12 on Metform500Bid+Actos15 showed BS=157, A1c=6.8; Actos is too $$ & we will switch to GLIMEP1mg - 1/2 Qam.  MORBID OBESITY (ICD-278.01) - we reviewed weight reducing diet & exercise program... ~  8/09: weight = 237# which is down 6# since 2008... discussed diet + exercise... ~  10/10: weight = 248#... reviewed diet + exercise perscription for wt reduction... ~  10/11:  weight = 248# ~  10/12:  Weight = 244#  COLONIC POLYPS (ICD-211.3) - pt finally had routine screening colonoscopy 11/09 w/ 3 adenomatous polyps removed by DrDBrodie... f/u planned 65yrs... ~  10/12:  DrDBrodie suggested f/u colon in 3 yrs, we  will send flag to her to review chart & set this up...  BACK PAIN, LUMBAR (ICD-724.2) - s/p lumbar lam in 1977...  HEADACHE (ICD-784.0) - hx dizziness secondary to hyperventilation in past w/ eval from Neurology...  ANXIETY DISORDER (ICD-300.00) - on ALPRAZOLAM 0.5mg  Prn...  HEALTH MAINTENANCE:  ~  GI:  ?due for f/u colon w/ DrBrodie> we will send inquiry... ~  GYN:  She doesn't currently have a GYN, ?who she saw prev?, encouraged to pick & call for PAP, Mammogram, BMD... ~  Immuniz:  She receives the seasonal Flu vaccine each fall...   Past Surgical History  Procedure Date  . Pelvic abcess drainage 1970    Dr. Merlene Morse  . Lumbar laminectomy 1997    Dr. Roxan Hockey    Outpatient Encounter Prescriptions as of 05/26/2011  Medication Sig Dispense Refill  . ALPRAZolam (XANAX) 0.5 MG tablet Take 0.5 mg by mouth at bedtime as needed.        Marland Kitchen aspirin 81 MG tablet Take 81 mg by mouth daily.        . ferrous sulfate 325 (65 FE) MG tablet Take 325 mg by mouth daily with breakfast.        . hydrochlorothiazide (HYDRODIURIL) 25 MG tablet Take 25 mg by mouth daily.        Marland Kitchen lisinopril (PRINIVIL,ZESTRIL) 20 MG tablet Take 20 mg by mouth daily.        . metFORMIN (GLUCOPHAGE) 500 MG tablet Take 500 mg by mouth 2 (two) times daily with a meal.        . Multiple Vitamins-Minerals (MULTIPLE VITAMINS/WOMENS PO) Take 1 tablet by mouth daily.        . pioglitazone (ACTOS) 30 MG tablet Take 30 mg by mouth daily.        . potassium chloride SA (KLOR-CON M20) 20 MEQ tablet Take 20 mEq by mouth daily.        . simvastatin (ZOCOR) 20 MG tablet Take 20 mg by mouth at bedtime.          No Known Allergies   Current Medications, Allergies, Past Medical History, Past Surgical History, Family History, and Social History were reviewed in [employer] record.     Review of Systems        The patient complains of fatigue, dyspnea on exertion, stiffness, and arthritis.  The patient  denies fever, chills, sweats, anorexia, weakness, malaise, weight loss, sleep disorder, blurring, diplopia, eye irritation, eye discharge, vision loss, eye pain, photophobia, earache, ear discharge, tinnitus, decreased hearing, nasal congestion, nosebleeds, sore throat, hoarseness, chest pain, palpitations, syncope, orthopnea, PND, peripheral edema, cough, dyspnea at rest, excessive sputum, hemoptysis, wheezing, pleurisy, nausea, vomiting, diarrhea, constipation, change in bowel habits,  abdominal pain, melena, hematochezia, jaundice, gas/bloating, indigestion/heartburn, dysphagia, odynophagia, dysuria, hematuria, urinary frequency, urinary hesitancy, nocturia, incontinence, back pain, joint pain, joint swelling, muscle cramps, muscle weakness, sciatica, restless legs, leg pain at night, leg pain with exertion, rash, itching, dryness, suspicious lesions, paralysis, paresthesias, seizures, tremors, vertigo, transient blindness, frequent falls, frequent headaches, difficulty walking, depression, anxiety, memory loss, confusion, cold intolerance, heat intolerance, polydipsia, polyphagia, polyuria, unusual weight change, abnormal bruising, bleeding, enlarged lymph nodes, urticaria, allergic rash, hay fever, and recurrent infections.     Objective:   Physical Exam     WD, Obese, 66 y/o WF in NAD... GENERAL:  Alert & oriented; pleasant & cooperative... HEENT:  [place]/AT, EOM-wnl, PERRLA, EACs-clear, TMs-wnl, NOSE-clear, THROAT-clear & wnl. NECK:  Supple w/ fairROM; no JVD; normal carotid impulses w/o bruits; no thyromegaly or nodules palpated; no lymphadenopathy. CHEST:  Clear to P & A; without wheezes/ rales/ or rhonchi. HEART:  Regular Rhythm; without murmurs/ rubs/ or gallops. ABDOMEN:  Obese, soft & nontender w/ panniculus; normal bowel sounds; no organomegaly or masses detected. EXT: without deformities, mild arthritic changes; no varicose veins/ +venous insuffic/ tr edema... NEURO:  CN's intact;  no focal  neuro deficits... DERM:  no rash, no lesions noted...  RADIOLOGY DATA:  Reviewed in the EPIC EMR & discussed w/ the patient...  LABORATORY DATA:  Reviewed in the EPIC EMR & discussed w/ the patient...   Assessment & Plan:   HBP>  Controlled on her meds but needs better diet & wt reduction...  Ven Insuffic>  On sodium restriction, elevation, support hose;  She has HCT as well helping the edema...  CHOL>  On Simva20 w/ fair chol numbers; but needs much better low fat diet 7 wt reduction to improve the TG...  DM>  Her A1c remains in the 6's;  We will change the Actos to AMARYL 1mg - start w/ 1/2 tab daily in the AM...  Obesity>  BMI in the 40's; she must diet, exercise, get the wt down...  Hx colon polyps>  Due for 43yr f/u colon per DrDBrodie, we will send flag...  Hx LBP>  She had LLam in 1977; uses OTC analgesics as needed...  Anxiety>  She has alpraz for prn use.Marland KitchenMarland Kitchen

## 2011-05-26 NOTE — Patient Instructions (Signed)
Today we updated your med list in our EPIC system...    We refilled your meds per request, & we decided to change the Actos to GLIMEPIRIDE 1mg - take 1/2 tab in AM...  Today we did your follow up CXR...    Please call the [facility] in a few days for your results...    Dial [id number] & when prompted enter your patient number followed by the # symbol...    Your patient number is:  [id number]#  We reviewed your recent blood work as well:    Your Triglycerides were up at 292 (goal<150), and the A1c was 6.8 (goal in low 6's)...    We need a better low fat, low carb, wt reducing diet...    Work on losing 10-15 lbs, & increase your exercise program (consider "silver sneakers"...  In about 4-6 months> give [place] a call & mention that you would like to have your A1c rechecked on the Metformin+Glimepiride regimen...  We will arrange for a Mammogram & baseline Bone Density test for you...  We will ask DrDBrodie to check regarding the timing of your follow up colonoscopy...  We gave you the 2012 Flu vaccine today...  Call for any questions.Marland KitchenMarland Kitchen

## 2011-05-27 ENCOUNTER — Encounter: Payer: Self-pay | Admitting: Pulmonary Disease

## 2011-05-30 ENCOUNTER — Ambulatory Visit (INDEPENDENT_AMBULATORY_CARE_PROVIDER_SITE_OTHER)
Admission: RE | Admit: 2011-05-30 | Discharge: 2011-05-30 | Disposition: A | Discharge status: Home / Self Care | Payer: Managed Care, Other (non HMO) | Source: Ambulatory Visit

## 2011-05-30 DIAGNOSIS — Z13820 Encounter for screening for osteoporosis: Secondary | ICD-10-CM

## 2011-05-30 DIAGNOSIS — Z0000 Encounter for general adult medical examination without abnormal findings: Secondary | ICD-10-CM

## 2011-06-02 ENCOUNTER — Encounter: Payer: Self-pay | Admitting: Pulmonary Disease

## 2011-06-13 ENCOUNTER — Encounter: Payer: Self-pay | Admitting: Pulmonary Disease

## 2011-08-04 ENCOUNTER — Encounter: Payer: Self-pay | Admitting: Internal Medicine

## 2011-10-04 ENCOUNTER — Telehealth: Payer: Self-pay | Admitting: Pulmonary Disease

## 2011-10-04 DIAGNOSIS — E119 Type 2 diabetes mellitus without complications: Secondary | ICD-10-CM

## 2011-10-04 NOTE — Telephone Encounter (Signed)
Called, spoke with pt.  At last OV with Dr. Kriste Basque on 05/26/11, she was told In about 4-6 months> give [place] a call & mention that you would like to have your A1c rechecked on the Metformin+Glimepiride regimen...  Pt states she would now like to come in for this.   Order placed for a1c -- pt aware and voiced no further questions/cocnerns at this time.

## 2011-10-06 ENCOUNTER — Other Ambulatory Visit (INDEPENDENT_AMBULATORY_CARE_PROVIDER_SITE_OTHER): Payer: Managed Care, Other (non HMO)

## 2011-10-06 DIAGNOSIS — E119 Type 2 diabetes mellitus without complications: Secondary | ICD-10-CM

## 2011-10-06 LAB — HEMOGLOBIN A1C: Hgb A1c MFr Bld: 6.7 % — ABNORMAL HIGH (ref 4.6–6.5)

## 2012-03-20 ENCOUNTER — Telehealth: Payer: Self-pay | Admitting: Pulmonary Disease

## 2012-03-20 NOTE — Telephone Encounter (Signed)
Called and spoke with pt and she is aware that rx has been done by SN and pt requested that this be mailed to her.  Pt is aware that this will be mailed out tomorrow.  Nothing further is needed.

## 2012-03-20 NOTE — Telephone Encounter (Signed)
Please advise Dr. Kriste Basque thanks  Last OV 05/26/11 next OV 05/31/12

## 2012-04-25 ENCOUNTER — Encounter: Payer: Self-pay | Admitting: Internal Medicine

## 2012-05-01 ENCOUNTER — Encounter: Payer: Self-pay | Admitting: Internal Medicine

## 2012-05-21 ENCOUNTER — Telehealth: Payer: Self-pay | Admitting: Pulmonary Disease

## 2012-05-21 NOTE — Telephone Encounter (Signed)
Please advise what labs pt will need for appt 05/31/12 (cpx). thanks

## 2012-05-22 ENCOUNTER — Other Ambulatory Visit: Payer: Self-pay | Admitting: Pulmonary Disease

## 2012-05-22 DIAGNOSIS — Z0000 Encounter for general adult medical examination without abnormal findings: Secondary | ICD-10-CM

## 2012-05-22 NOTE — Telephone Encounter (Signed)
Called and left message on the pts cell number to make her aware that her labs are in the computer.  Nothing further is needed.

## 2012-05-24 ENCOUNTER — Other Ambulatory Visit (INDEPENDENT_AMBULATORY_CARE_PROVIDER_SITE_OTHER): Payer: Managed Care, Other (non HMO)

## 2012-05-24 DIAGNOSIS — Z0000 Encounter for general adult medical examination without abnormal findings: Secondary | ICD-10-CM

## 2012-05-24 LAB — CBC WITH DIFFERENTIAL/PLATELET
Basophils Relative: 0.5 % (ref 0.0–3.0)
Eosinophils Absolute: 0.1 10*3/uL (ref 0.0–0.7)
Eosinophils Relative: 1.5 % (ref 0.0–5.0)
HCT: 40.1 % (ref 36.0–46.0)
Lymphs Abs: 1.5 10*3/uL (ref 0.7–4.0)
MCHC: 33.4 g/dL (ref 30.0–36.0)
MCV: 92.2 fl (ref 78.0–100.0)
Monocytes Absolute: 0.5 10*3/uL (ref 0.1–1.0)
Neutrophils Relative %: 71.5 % (ref 43.0–77.0)
RBC: 4.34 Mil/uL (ref 3.87–5.11)

## 2012-05-24 LAB — BASIC METABOLIC PANEL
BUN: 17 mg/dL (ref 6–23)
CO2: 25 mEq/L (ref 19–32)
Chloride: 97 mEq/L (ref 96–112)
Glucose, Bld: 138 mg/dL — ABNORMAL HIGH (ref 70–99)
Potassium: 4.2 mEq/L (ref 3.5–5.1)
Sodium: 136 mEq/L (ref 135–145)

## 2012-05-24 LAB — HEMOGLOBIN A1C: Hgb A1c MFr Bld: 6.2 % (ref 4.6–6.5)

## 2012-05-24 LAB — HEPATIC FUNCTION PANEL
ALT: 39 U/L — ABNORMAL HIGH (ref 0–35)
AST: 48 U/L — ABNORMAL HIGH (ref 0–37)
Albumin: 4.1 g/dL (ref 3.5–5.2)
Alkaline Phosphatase: 53 U/L (ref 39–117)
Total Protein: 7.9 g/dL (ref 6.0–8.3)

## 2012-05-24 LAB — LIPID PANEL: HDL: 57.8 mg/dL (ref >39.00)

## 2012-05-30 ENCOUNTER — Encounter: Payer: Self-pay | Admitting: *Deleted

## 2012-05-31 ENCOUNTER — Encounter: Payer: Self-pay | Admitting: Pulmonary Disease

## 2012-05-31 ENCOUNTER — Ambulatory Visit (INDEPENDENT_AMBULATORY_CARE_PROVIDER_SITE_OTHER): Payer: Managed Care, Other (non HMO) | Admitting: Pulmonary Disease

## 2012-05-31 VITALS — BP 130/70 | HR 88 | Temp 97.4°F | Ht 61.4 in | Wt 225.0 lb

## 2012-05-31 DIAGNOSIS — F411 Generalized anxiety disorder: Secondary | ICD-10-CM

## 2012-05-31 DIAGNOSIS — I872 Venous insufficiency (chronic) (peripheral): Secondary | ICD-10-CM

## 2012-05-31 DIAGNOSIS — D126 Benign neoplasm of colon, unspecified: Secondary | ICD-10-CM

## 2012-05-31 DIAGNOSIS — I10 Essential (primary) hypertension: Secondary | ICD-10-CM

## 2012-05-31 DIAGNOSIS — M545 Low back pain: Secondary | ICD-10-CM

## 2012-05-31 DIAGNOSIS — E7800 Pure hypercholesterolemia, unspecified: Secondary | ICD-10-CM

## 2012-05-31 DIAGNOSIS — Z0000 Encounter for general adult medical examination without abnormal findings: Secondary | ICD-10-CM

## 2012-05-31 DIAGNOSIS — Z23 Encounter for immunization: Secondary | ICD-10-CM

## 2012-05-31 DIAGNOSIS — E119 Type 2 diabetes mellitus without complications: Secondary | ICD-10-CM

## 2012-05-31 MED ORDER — GLIMEPIRIDE 1 MG PO TABS: ORAL_TABLET | ORAL | Status: DC

## 2012-05-31 MED ORDER — LISINOPRIL 20 MG PO TABS: 20.0000 mg | ORAL_TABLET | Freq: Every day | ORAL | Status: DC

## 2012-05-31 MED ORDER — SIMVASTATIN 40 MG PO TABS: 40.0000 mg | ORAL_TABLET | Freq: Every day | ORAL | Status: DC

## 2012-05-31 MED ORDER — METFORMIN HCL 500 MG PO TABS: 500.0000 mg | ORAL_TABLET | Freq: Two times a day (BID) | ORAL | Status: DC

## 2012-05-31 MED ORDER — HYDROCHLOROTHIAZIDE 25 MG PO TABS: 25.0000 mg | ORAL_TABLET | Freq: Every day | ORAL | Status: DC

## 2012-05-31 MED ORDER — POTASSIUM CHLORIDE CRYS ER 20 MEQ PO TBCR: 20.0000 meq | EXTENDED_RELEASE_TABLET | Freq: Every day | ORAL | Status: DC

## 2012-05-31 NOTE — Progress Notes (Addendum)
Subjective:    Patient ID: Cynthia [name], female    DOB: 09-13-1945, 66 y.o.   MRN: [id number]  HPI 66 y/o WF here for a follow up visit... she has multiple medical problems as noted below...   ~  May 20, 2009:  63 WF here for preventive health visit- hx HBP, Chol, DM, Obesity... weight up 11# this yr to 248#, 5'2" tall, BMI= 45... not motivated to diet/ exercise/ etc & we discussed this in detail... she has no new complaints or concerns, requesting refill meds for 90d supplies...  ~  May 21, 2010:  she's had a good yr- no new complaints or concerns... unfortunately [place] has not lost any weight over the past yr & not really dieting or exercising... BP controlled on meds;  Chol looks good on Simva20;  DM remains controlled on oral agents;  we discussed diet + exercise needed to effect weight reduction... she requests 90d refills + flu shot...  ~  May 26, 2011:  Yearly ROV & review> she has been under considerable stress & spent much of the past yr caring for her cousin Keyaira Clapham (passes away April 14, 2023)...    HBP> on Lisinopril20, HCT25, K20; BP= 142/62 & she notes occas dizzy, some DOE & edema; otherw denies CP/ palpit/ SOB/ etc...    Ven Insuffic> on low sodium diet + the diuretic; reminded of elevation & support hose too...    Chol> on Simva20; Chol numbers look good but TGs are elev & we reviewed low fat diet, exercise, & wt reduction program...    DM> on Metform500Bid & Actos15 but she refuses the Actos due to cost; BS=157, A1c=6.8; we discussed change Actos to Glimepiride1mg - start 1/2 tab Qam...    Obesity> wt=244# which is down 4# in the last yr; she is 71" tall for a BMI=44-45; we reviewed the necessary diet & exercise program for substantial wt reduction...    Anxiety> on Alprazolam0.5mg  prn...  ~  May 31, 2012:  Yearly ROV & CPX> Cynthia [name] reports a good year 7 has no new complaints or concerns; she has done better w/ her diet & exercise program & wt is down 18# to 225# today... We  reviewed the following medical problems during today's office visit>>     HBP> on ASA81, Lisinopril20, HCT25, K20; BP= 130/70 & she feels well w/o CP, palpit, dizzy, SOB, edema, etc...    Ven Insuffic> on low sodium diet + the diuretic; reminded of elevation & support hose too; she denies swelling...    Chol> on Simva20; FLP shows TChol 204, TG 286, HDL 58, LDL 114; not at goals despite wt reduction, therefore incr SIMVA40...    DM> on Metform500Bid & Glimep1mg -1/2 tabQam; BS=138, A1c=6.2; continue same & the wt loss program!    Obesity> wt=225# which is down 18# in the last yr; we reviewed the necessary diet & exercise program for substantial wt reduction...    GI- colon polyps> she had 3 adenomatous polyps removed 11/09 by DrDBrodie & she plans f/u soon...    LBP> she denies back pain but has some left hip discomfort w/ walking; rec OTC analgesics as needed...    Anxiety> on Alprazolam0.5mg  prn... We reviewed prob list, meds, xrays and labs> see below for updates >> OK Flu vaccine & refill meds today... LABS 10/13:  FLP not at goal on Simva20 w/ TG=286 LDL=114;  Chems- ok x BS=138 A1c=6.2 LFTs=sl elev;  CBC- wnl;  TSH=0.81  Problem List:  ASTHMATIC BRONCHITIS, ACUTE (ICD-466.0) - no recent exas...  HYPERTENSION (ICD-401.9) - on ASA 81mg /d,  LISINOPRIL 20mg /d,  HCTZ 25mg /d,  & K20/d... ~  baseline EKG w/ poor R progression & NSSTTWA... ~  Cath in 9/04 was normal- norm coronaries, norm LVF, etc... rec for aggressive primary prevention. ~  10/12:  CXR showed normal heart size, clear lungs but sl low lung vol's;  EKG showed NSR, ?poor R progression, NAD... ~  10/13: on ASA81, Lisinopril20, HCT25, K20; BP= 130/70 & she feels well w/o CP, palpit, dizzy, SOB, edema, etc...  VENOUS INSUFFICIENCY (ICD-459.81) - she has mild VI & 1+edema... on low sodium diet, elevation, support hose...  HYPERCHOLESTEROLEMIA (ICD-272.0) - on diet + SIMVASTATIN 20mg /d... ~  FLP 6/08 on Simva20 showed TChol  193, TG 205, HDL 56, LDL 108...  ~  FLP 7/09 on Simva20 showed TChol 176, TG 161, HDL 50, LDL 94... keep same med, better diet effort. ~  FLP 10/10 on Simva20 showed TChol 178, TG 190, HDL 51, LDL 89 ~  FLP 10/11 on Simva20 showed TChol 197, TG 213, HDL 58, LDL 120 ~  FLP 10/12 on Simva20 showed TChol 186, TG 292, HDL 59, LDL 101... Needs better low fat diet & wt reduction. ~  FLP 10/13 on Simva20 showed TChol 204, TG 286, HDL 58, LDL 114... Not at goals, rec incr SIMVA40...  DIABETES MELLITUS (ICD-250.00) - on diet + METFORMIN 500mg Bid,  GLIMEPIRIDE 1mg -1/2 tab Qam...  ~  labs 6/09 showed BS= 144, HgA1c= 7.4... she was on Metformin once daily- incr to Bid... ~  labs 7/09 showed BS= 138, HgA1c= 6.4.Marland KitchenMarland Kitchen rec better diet & get weight down... ~  labs 10/10 on Metform500Bid+Actos15 showed BS= 131, A1c= 6.6.Marland Kitchen. rec> same meds, better diet, get wt down! ~  labs 10/11 on Metform500Bid+Actos15 showed BS= 123, A1c= 6.6 ~  Labs 10/12 on Metform500Bid+Actos15 showed BS=157, A1c=6.8; Actos is too $$ & we will switch to GLIMEP1mg - 1/2 Qam. ~  Labs 10/13 on Metform500Bid+Glim0.5 showed BS=138, A1c=6.2  MORBID OBESITY (ICD-278.01) - we reviewed weight reducing diet & exercise program... ~  8/09: weight = 237# which is down 6# since 2008... discussed diet + exercise... ~  10/10: weight = 248#... reviewed diet + exercise perscription for wt reduction... ~  10/11:  weight = 248# ~  10/12:  Weight = 244# ~  10/13:  Weight = 225#  COLONIC POLYPS (ICD-211.3) - pt finally had routine screening colonoscopy 11/09 w/ 3 adenomatous polyps removed by DrDBrodie... f/u planned 34yrs... ~  10/12:  DrDBrodie suggested f/u colon in 3 yrs, we will send flag to her to review chart & set this up...  BACK PAIN, LUMBAR (ICD-724.2) - s/p lumbar lam in 1977... ~  BMD 10/12 here showed TScores +2.6 in Spine, and -1.4 in left FemNeck; Rec to take calcium, MVI, VitD & wt bearing exercise...  HEADACHE (ICD-784.0) - hx dizziness  secondary to hyperventilation in past w/ eval from Neurology...  ANXIETY DISORDER (ICD-300.00) - on ALPRAZOLAM 0.5mg  Prn...  HEALTH MAINTENANCE:  ~  GI:  ?due for f/u colon w/ DrBrodie> we will send inquiry... ~  GYN:  She doesn't currently have a GYN, ?who she saw prev?, encouraged to pick & call for PAP, Mammogram, BMD... ~  Immuniz:  She receives the seasonal Flu vaccine each fall...   Past Surgical History  Procedure Date  . Pelvic abcess drainage 1970    Dr. Merlene Morse  . Lumbar laminectomy 1997  Dr. Roxan Hockey    Outpatient Encounter Prescriptions as of 05/31/2012  Medication Sig Dispense Refill  . ALPRAZolam (XANAX) 0.5 MG tablet Take 1 tablet (0.5 mg total) by mouth at bedtime as needed.  90 tablet  5  . aspirin 81 MG tablet Take 81 mg by mouth daily.        . cholecalciferol (VITAMIN D) 1000 UNITS tablet Take 1,000 Units by mouth daily.      . ferrous sulfate 325 (65 FE) MG tablet Take 325 mg by mouth daily with breakfast.        . glimepiride (AMARYL) 1 MG tablet Take as directed  90 tablet  3  . hydrochlorothiazide (HYDRODIURIL) 25 MG tablet Take 1 tablet (25 mg total) by mouth daily.  90 tablet  3  . lisinopril (PRINIVIL,ZESTRIL) 20 MG tablet Take 1 tablet (20 mg total) by mouth daily.  90 tablet  3  . metFORMIN (GLUCOPHAGE) 500 MG tablet Take 1 tablet (500 mg total) by mouth 2 (two) times daily with a meal.  180 tablet  3  . Multiple Vitamins-Minerals (MULTIPLE VITAMINS/WOMENS PO) Take 1 tablet by mouth daily.        . naproxen sodium (ANAPROX) 220 MG tablet Take 220 mg by mouth 2 (two) times daily with a meal.      . potassium chloride SA (KLOR-CON M20) 20 MEQ tablet Take 1 tablet (20 mEq total) by mouth daily.  90 tablet  3  . simvastatin (ZOCOR) 20 MG tablet Take 1 tablet (20 mg total) by mouth at bedtime.  90 tablet  3    No Known Allergies   Current Medications, Allergies, Past Medical History, Past Surgical History, Family History, and Social History were  reviewed in [employer] record.     Review of Systems        The patient complains of fatigue, dyspnea on exertion, stiffness, and arthritis.  The patient denies fever, chills, sweats, anorexia, weakness, malaise, weight loss, sleep disorder, blurring, diplopia, eye irritation, eye discharge, vision loss, eye pain, photophobia, earache, ear discharge, tinnitus, decreased hearing, nasal congestion, nosebleeds, sore throat, hoarseness, chest pain, palpitations, syncope, orthopnea, PND, peripheral edema, cough, dyspnea at rest, excessive sputum, hemoptysis, wheezing, pleurisy, nausea, vomiting, diarrhea, constipation, change in bowel habits, abdominal pain, melena, hematochezia, jaundice, gas/bloating, indigestion/heartburn, dysphagia, odynophagia, dysuria, hematuria, urinary frequency, urinary hesitancy, nocturia, incontinence, back pain, joint pain, joint swelling, muscle cramps, muscle weakness, sciatica, restless legs, leg pain at night, leg pain with exertion, rash, itching, dryness, suspicious lesions, paralysis, paresthesias, seizures, tremors, vertigo, transient blindness, frequent falls, frequent headaches, difficulty walking, depression, anxiety, memory loss, confusion, cold intolerance, heat intolerance, polydipsia, polyphagia, polyuria, unusual weight change, abnormal bruising, bleeding, enlarged lymph nodes, urticaria, allergic rash, hay fever, and recurrent infections.     Objective:   Physical Exam     WD, Obese, 66 y/o WF in NAD... GENERAL:  Alert & oriented; pleasant & cooperative... HEENT:  [place]/AT, EOM-wnl, PERRLA, EACs-clear, TMs-wnl, NOSE-clear, THROAT-clear & wnl. NECK:  Supple w/ fairROM; no JVD; normal carotid impulses w/o bruits; no thyromegaly or nodules palpated; no lymphadenopathy. CHEST:  Clear to P & A; without wheezes/ rales/ or rhonchi. HEART:  Regular Rhythm; without murmurs/ rubs/ or gallops. ABDOMEN:  Obese, soft & nontender w/ panniculus; normal  bowel sounds; no organomegaly or masses detected. EXT: without deformities, mild arthritic changes; no varicose veins/ +venous insuffic/ tr edema... NEURO:  CN's intact;  no focal neuro deficits... DERM:  no rash, no lesions noted.Marland KitchenMarland Kitchen  RADIOLOGY DATA:  Reviewed in the EPIC EMR & discussed w/ the patient...  LABORATORY DATA:  Reviewed in the EPIC EMR & discussed w/ the patient...   Assessment & Plan:    HBP>  Controlled on her meds but needs better diet & wt reduction...  Ven Insuffic>  On sodium restriction, elevation, support hose;  She has HCT as well helping the edema...  CHOL>  On Simva20 & not at goals; we decided to incr to SIMVA 40 & stress low fat diet...  DM>  Her A1c remains in the 6's;  She will watch BS at home in light of her A1c=6.2 & current meds...  Obesity>  BMI in the 40's; she must diet, exercise, get the wt down...  Hx colon polyps>  Due for 45yr f/u colon per DrDBrodie, we will send flag...  Hx LBP>  She had LLam in 1977; uses OTC analgesics as needed...  Anxiety>  She has alpraz for prn use...   Patient's Medications  New Prescriptions   SIMVASTATIN (ZOCOR) 40 MG TABLET    Take 1 tablet (40 mg total) by mouth at bedtime.  Previous Medications   ALPRAZOLAM (XANAX) 0.5 MG TABLET    Take 1 tablet (0.5 mg total) by mouth at bedtime as needed.   ASPIRIN 81 MG TABLET    Take 81 mg by mouth daily.     CALCIUM CARBONATE ANTACID (TUMS E-X PO)    Take by mouth as needed.   CHOLECALCIFEROL (VITAMIN D) 1000 UNITS TABLET    Take 1,000 Units by mouth daily.   FAMOTIDINE (PEPCID) 10 MG TABLET    Take 10 mg by mouth as needed.   FERROUS SULFATE 325 (65 FE) MG TABLET    Take 325 mg by mouth daily with breakfast.     MULTIPLE VITAMINS-MINERALS (MULTIPLE VITAMINS/WOMENS PO)    Take 1 tablet by mouth daily.     NAPROXEN SODIUM (ANAPROX) 220 MG TABLET    Take 220 mg by mouth 2 (two) times daily with a meal.   VITAMIN B-12 (CYANOCOBALAMIN) 1000 MCG TABLET    Take 1,000 mcg by  mouth daily.  Modified Medications   Modified Medication Previous Medication   GLIMEPIRIDE (AMARYL) 1 MG TABLET glimepiride (AMARYL) 1 MG tablet      Take 1/2 tablet daily    Take as directed   HYDROCHLOROTHIAZIDE (HYDRODIURIL) 25 MG TABLET hydrochlorothiazide (HYDRODIURIL) 25 MG tablet      Take 1 tablet (25 mg total) by mouth daily.    Take 1 tablet (25 mg total) by mouth daily.   LISINOPRIL (PRINIVIL,ZESTRIL) 20 MG TABLET lisinopril (PRINIVIL,ZESTRIL) 20 MG tablet      Take 1 tablet (20 mg total) by mouth daily.    Take 1 tablet (20 mg total) by mouth daily.   METFORMIN (GLUCOPHAGE) 500 MG TABLET metFORMIN (GLUCOPHAGE) 500 MG tablet      Take 1 tablet (500 mg total) by mouth 2 (two) times daily with a meal.    Take 1 tablet (500 mg total) by mouth 2 (two) times daily with a meal.   POTASSIUM CHLORIDE SA (KLOR-CON M20) 20 MEQ TABLET potassium chloride SA (KLOR-CON M20) 20 MEQ tablet      Take 1 tablet (20 mEq total) by mouth daily.    Take 1 tablet (20 mEq total) by mouth daily.  Discontinued Medications   SIMVASTATIN (ZOCOR) 20 MG TABLET    Take 1 tablet (20 mg total) by mouth at bedtime.

## 2012-05-31 NOTE — Patient Instructions (Addendum)
Today we updated your med list in our EPIC system...    Continue your current medications the same...    We decided to increase the SIMVASTATIN to 40mg /d...  We gave you a copy of your fasting blood work...  Keep up the good work w/ diet & exercise, your wt is down 18#  Call for any questions...  Let's plan a follow up visit in 1 yr, sooner if needed for problems.Marland KitchenMarland Kitchen

## 2012-06-19 ENCOUNTER — Ambulatory Visit (AMBULATORY_SURGERY_CENTER): Payer: Managed Care, Other (non HMO)

## 2012-06-19 VITALS — Ht 61.5 in | Wt 225.2 lb

## 2012-06-19 DIAGNOSIS — Z1211 Encounter for screening for malignant neoplasm of colon: Secondary | ICD-10-CM

## 2012-06-19 DIAGNOSIS — Z86010 Personal history of colonic polyps: Secondary | ICD-10-CM

## 2012-06-19 MED ORDER — MOVIPREP 100 G PO SOLR: ORAL | Status: DC

## 2012-07-03 ENCOUNTER — Ambulatory Visit (AMBULATORY_SURGERY_CENTER): Payer: Managed Care, Other (non HMO) | Admitting: Internal Medicine

## 2012-07-03 ENCOUNTER — Encounter: Payer: Self-pay | Admitting: Internal Medicine

## 2012-07-03 VITALS — BP 93/51 | HR 71 | Temp 98.2°F | Resp 17 | Ht 61.0 in | Wt 225.0 lb

## 2012-07-03 DIAGNOSIS — D126 Benign neoplasm of colon, unspecified: Secondary | ICD-10-CM

## 2012-07-03 DIAGNOSIS — Z1211 Encounter for screening for malignant neoplasm of colon: Secondary | ICD-10-CM

## 2012-07-03 LAB — GLUCOSE, CAPILLARY: Glucose-Capillary: 130 mg/dL — ABNORMAL HIGH (ref 70–99)

## 2012-07-03 MED ORDER — SODIUM CHLORIDE 0.9 % IV SOLN: 500.0000 mL | INTRAVENOUS | Status: DC

## 2012-07-03 NOTE — Progress Notes (Signed)
Patient did not experience any of the following events: a burn prior to discharge; a fall within the facility; wrong site/side/patient/procedure/implant event; or a hospital transfer or hospital admission upon discharge from the facility. (G8907) Patient did not have preoperative order for IV antibiotic SSI prophylaxis. (G8918)  

## 2012-07-03 NOTE — Patient Instructions (Signed)
HIGH FIBER DIET  YOU HAD AN ENDOSCOPIC PROCEDURE TODAY AT THE [facility]: Refer to the procedure report that was given to you for any specific questions about what was found during the examination.  If the procedure report does not answer your questions, please call your gastroenterologist to clarify.  If you requested that your care partner not be given the details of your procedure findings, then the procedure report has been included in a sealed envelope for you to review at your convenience later.  YOU SHOULD EXPECT: Some feelings of bloating in the abdomen. Passage of more gas than usual.  Walking can help get rid of the air that was put into your GI tract during the procedure and reduce the bloating. If you had a lower endoscopy (such as a colonoscopy or flexible sigmoidoscopy) you may notice spotting of blood in your stool or on the toilet paper. If you underwent a bowel prep for your procedure, then you may not have a normal bowel movement for a few days.  DIET: Your first meal following the procedure should be a light meal and then it is ok to progress to your normal diet.  A half-sandwich or bowl of soup is an example of a good first meal.  Heavy or fried foods are harder to digest and may make you feel nauseous or bloated.  Likewise meals heavy in dairy and vegetables can cause extra gas to form and this can also increase the bloating.  Drink plenty of fluids but you should avoid alcoholic beverages for 24 hours.  ACTIVITY: Your care partner should take you home directly after the procedure.  You should plan to take it easy, moving slowly for the rest of the day.  You can resume normal activity the day after the procedure however you should NOT DRIVE or use heavy machinery for 24 hours (because of the sedation medicines used during the test).    SYMPTOMS TO REPORT IMMEDIATELY: A gastroenterologist can be reached at any hour.  During normal business hours, 8:30 AM to 5:00 PM Monday  through Friday, call [phone].  After hours and on weekends, please call the GI answering service at [phone] who will take a message and have the physician on call contact you.   Following lower endoscopy (colonoscopy or flexible sigmoidoscopy):  Excessive amounts of blood in the stool  Significant tenderness or worsening of abdominal pains  Swelling of the abdomen that is new, acute  Fever of 100F or higher FOLLOW UP: If any biopsies were taken you will be contacted by phone or by letter within the next 1-3 weeks.  Call your gastroenterologist if you have not heard about the biopsies in 3 weeks.  Our staff will call the home number listed on your records the next business day following your procedure to check on you and address any questions or concerns that you may have at that time regarding the information given to you following your procedure. This is a courtesy call and so if there is no answer at the home number and we have not heard from you through the emergency physician on call, we will assume that you have returned to your regular daily activities without incident.  SIGNATURES/CONFIDENTIALITY: You and/or your care partner have signed paperwork which will be entered into your electronic medical record.  These signatures attest to the fact that that the information above on your After Visit Summary has been reviewed and is understood.  Full responsibility of the   confidentiality of this discharge information lies with you and/or your care-partner.  

## 2012-07-03 NOTE — Op Note (Signed)
[facility] [address] [place], [address]   COLONOSCOPY PROCEDURE REPORT  PATIENT: Cynthia [name], Cynthia [name].  MR#: [id number] BIRTHDATE: Dec 24, 1945 , 66  yrs. old GENDER: Female ENDOSCOPIST: Hart Carwin, MD REFERRED BY:  Alroy Dust, M.D. PROCEDURE DATE:  07/03/2012 PROCEDURE:   Colonoscopy with cold biopsy polypectomy ASA CLASS:   Class III INDICATIONS:Patient's personal history of adenomatous colon polyps and colonoscopy 2009- adenomatous polyp. MEDICATIONS: MAC sedation, administered by CRNA and propofol (Diprivan) 250mg  IV  DESCRIPTION OF PROCEDURE:   After the risks and benefits and of the procedure were explained, informed consent was obtained.  A digital rectal exam revealed no abnormalities of the rectum.    The LB [id number]  endoscope was introduced through the anus and advanced to the cecum, which was identified by both the appendix and ileocecal valve .  The quality of the prep was good, using MoviPrep .  The instrument was then slowly withdrawn as the colon was fully examined.     COLON FINDINGS: A sessile polyp ranging between 3-64mm in size was found in the sigmoid colon.  A polypectomy was performed with cold forceps.  The resection was complete and the polyp tissue was completely retrieved.     Retroflexed views revealed no abnormalities.     The scope was then withdrawn from the patient and the procedure completed.  COMPLICATIONS: There were no complications. ENDOSCOPIC IMPRESSION: Sessile polyp ranging between 3-19mm in size was found in the sigmoid colon; polypectomy was performed with cold forceps  RECOMMENDATIONS: 1.  Await pathology results 2.  High fiber diet with liberal fluid intake.   REPEAT EXAM: In 10 year(s)  for Colonoscopy.  cc:  _______________________________ eSignedHart Carwin, MD 07/03/2012 10:07 AM

## 2012-07-04 ENCOUNTER — Telehealth: Payer: Self-pay | Admitting: *Deleted

## 2012-07-04 NOTE — Telephone Encounter (Signed)
  Follow up Call-  Call back number 07/03/2012  Post procedure Call Back phone  # [phone]  Permission to leave phone message Yes     Patient questions:  Do you have a fever, pain , or abdominal swelling? no Pain Score  0 *  Have you tolerated food without any problems? yes  Have you been able to return to your normal activities? yes  Do you have any questions about your discharge instructions: Diet   no Medications  no Follow up visit  no  Do you have questions or concerns about your Care? no  Actions: * If pain score is 4 or above: No action needed, pain <4.

## 2012-07-09 ENCOUNTER — Encounter: Payer: Self-pay | Admitting: Internal Medicine

## 2012-08-10 ENCOUNTER — Encounter: Payer: Self-pay | Admitting: Pulmonary Disease

## 2012-10-29 ENCOUNTER — Other Ambulatory Visit: Payer: Self-pay | Admitting: Pulmonary Disease

## 2013-05-02 ENCOUNTER — Telehealth: Payer: Self-pay | Admitting: Pulmonary Disease

## 2013-05-02 DIAGNOSIS — E559 Vitamin D deficiency, unspecified: Secondary | ICD-10-CM

## 2013-05-02 DIAGNOSIS — E119 Type 2 diabetes mellitus without complications: Secondary | ICD-10-CM

## 2013-05-02 DIAGNOSIS — I10 Essential (primary) hypertension: Secondary | ICD-10-CM

## 2013-05-02 DIAGNOSIS — E7800 Pure hypercholesterolemia, unspecified: Secondary | ICD-10-CM

## 2013-05-02 DIAGNOSIS — F411 Generalized anxiety disorder: Secondary | ICD-10-CM

## 2013-05-02 DIAGNOSIS — D126 Benign neoplasm of colon, unspecified: Secondary | ICD-10-CM

## 2013-05-02 NOTE — Telephone Encounter (Signed)
Called and spoke with pt and she is aware of change of appt from oct 31 to oct 30.  Pt requested that her labs be put in 1 week prior to her appt.  Pt is aware that this has been done and nothing further is needed.

## 2013-05-23 ENCOUNTER — Other Ambulatory Visit (INDEPENDENT_AMBULATORY_CARE_PROVIDER_SITE_OTHER): Payer: Medicare Other

## 2013-05-23 DIAGNOSIS — I10 Essential (primary) hypertension: Secondary | ICD-10-CM

## 2013-05-23 DIAGNOSIS — F411 Generalized anxiety disorder: Secondary | ICD-10-CM

## 2013-05-23 DIAGNOSIS — D126 Benign neoplasm of colon, unspecified: Secondary | ICD-10-CM

## 2013-05-23 DIAGNOSIS — E559 Vitamin D deficiency, unspecified: Secondary | ICD-10-CM

## 2013-05-23 DIAGNOSIS — E7800 Pure hypercholesterolemia, unspecified: Secondary | ICD-10-CM

## 2013-05-23 DIAGNOSIS — E119 Type 2 diabetes mellitus without complications: Secondary | ICD-10-CM

## 2013-05-23 LAB — CBC WITH DIFFERENTIAL/PLATELET
Basophils Relative: 0.4 % (ref 0.0–3.0)
Eosinophils Absolute: 0.1 10*3/uL (ref 0.0–0.7)
Eosinophils Relative: 2 % (ref 0.0–5.0)
HCT: 38.4 % (ref 36.0–46.0)
Lymphocytes Relative: 21.5 % (ref 12.0–46.0)
Lymphs Abs: 1.4 10*3/uL (ref 0.7–4.0)
MCHC: 33.9 g/dL (ref 30.0–36.0)
MCV: 90.1 fl (ref 78.0–100.0)
Monocytes Absolute: 0.4 10*3/uL (ref 0.1–1.0)
Platelets: 254 10*3/uL (ref 150.0–400.0)
RDW: 14.2 % (ref 11.5–14.6)
WBC: 6.7 10*3/uL (ref 4.5–10.5)

## 2013-05-23 LAB — LIPID PANEL
Cholesterol: 177 mg/dL (ref 0–200)
LDL Cholesterol: 74 mg/dL (ref 0–99)
Total CHOL/HDL Ratio: 3
VLDL: 37 mg/dL (ref 0.0–40.0)

## 2013-05-23 LAB — HEPATIC FUNCTION PANEL
AST: 40 U/L — ABNORMAL HIGH (ref 0–37)
Albumin: 4.3 g/dL (ref 3.5–5.2)
Alkaline Phosphatase: 55 U/L (ref 39–117)
Bilirubin, Direct: 0.1 mg/dL (ref 0.0–0.3)
Total Bilirubin: 0.6 mg/dL (ref 0.3–1.2)

## 2013-05-23 LAB — BASIC METABOLIC PANEL
BUN: 16 mg/dL (ref 6–23)
Chloride: 98 mEq/L (ref 96–112)
GFR: 82.33 mL/min (ref >60.00)
Glucose, Bld: 146 mg/dL — ABNORMAL HIGH (ref 70–99)
Potassium: 4.4 mEq/L (ref 3.5–5.1)
Sodium: 136 mEq/L (ref 135–145)

## 2013-05-23 LAB — TSH: TSH: 0.61 u[IU]/mL (ref 0.35–5.50)

## 2013-05-30 ENCOUNTER — Ambulatory Visit (INDEPENDENT_AMBULATORY_CARE_PROVIDER_SITE_OTHER): Payer: Medicare Other | Admitting: Pulmonary Disease

## 2013-05-30 ENCOUNTER — Encounter: Payer: Self-pay | Admitting: Pulmonary Disease

## 2013-05-30 VITALS — BP 134/70 | HR 83 | Temp 98.0°F | Ht 61.0 in | Wt 216.6 lb

## 2013-05-30 DIAGNOSIS — E7800 Pure hypercholesterolemia, unspecified: Secondary | ICD-10-CM

## 2013-05-30 DIAGNOSIS — M545 Low back pain, unspecified: Secondary | ICD-10-CM

## 2013-05-30 DIAGNOSIS — F411 Generalized anxiety disorder: Secondary | ICD-10-CM

## 2013-05-30 DIAGNOSIS — E119 Type 2 diabetes mellitus without complications: Secondary | ICD-10-CM

## 2013-05-30 DIAGNOSIS — Z23 Encounter for immunization: Secondary | ICD-10-CM

## 2013-05-30 DIAGNOSIS — I872 Venous insufficiency (chronic) (peripheral): Secondary | ICD-10-CM

## 2013-05-30 DIAGNOSIS — I10 Essential (primary) hypertension: Secondary | ICD-10-CM

## 2013-05-30 DIAGNOSIS — D126 Benign neoplasm of colon, unspecified: Secondary | ICD-10-CM

## 2013-05-30 DIAGNOSIS — M1990 Unspecified osteoarthritis, unspecified site: Secondary | ICD-10-CM

## 2013-05-30 DIAGNOSIS — E669 Obesity, unspecified: Secondary | ICD-10-CM

## 2013-05-30 MED ORDER — HYDROCHLOROTHIAZIDE 25 MG PO TABS: ORAL_TABLET | ORAL | Status: DC

## 2013-05-30 MED ORDER — LISINOPRIL 20 MG PO TABS: 20.0000 mg | ORAL_TABLET | Freq: Every day | ORAL | Status: DC

## 2013-05-30 MED ORDER — METFORMIN HCL 500 MG PO TABS: 500.0000 mg | ORAL_TABLET | Freq: Two times a day (BID) | ORAL | Status: DC

## 2013-05-30 MED ORDER — SIMVASTATIN 40 MG PO TABS: 40.0000 mg | ORAL_TABLET | Freq: Every day | ORAL | Status: DC

## 2013-05-30 MED ORDER — POTASSIUM CHLORIDE CRYS ER 20 MEQ PO TBCR: 20.0000 meq | EXTENDED_RELEASE_TABLET | Freq: Every day | ORAL | Status: DC

## 2013-05-30 NOTE — Progress Notes (Signed)
Subjective:    Patient ID: Cynthia [name], female    DOB: 03-06-1946, 67 y.o.   MRN: [id number]  HPI 67 y/o WF here for a follow up visit... she has multiple medical problems as noted below...   ~  May 26, 2011:  Yearly ROV & review> she has been under considerable stress & spent much of the past yr caring for her cousin Cynthia [name] (passes away 04/29/23)...    HBP> on Lisinopril20, HCT25, K20; BP= 142/62 & she notes occas dizzy, some DOE & edema; otherw denies CP/ palpit/ SOB/ etc...    Ven Insuffic> on low sodium diet + the diuretic; reminded of elevation & support hose too...    Chol> on Simva20; Chol numbers look good but TGs are elev & we reviewed low fat diet, exercise, & wt reduction program...    DM> on Metform500Bid & Actos15 but she refuses the Actos due to cost; BS=157, A1c=6.8; we discussed change Actos to Glimepiride1mg - start 1/2 tab Qam...    Obesity> wt=244# which is down 4# in the last yr; she is 45" tall for a BMI=44-45; we reviewed the necessary diet & exercise program for substantial wt reduction...    Anxiety> on Alprazolam0.5mg  prn...  ~  May 31, 2012:  Yearly ROV & CPX> Cynthia [name] reports a good year & has no new complaints or concerns; she has done better w/ her diet & exercise program & wt is down 18# to 225# today... We reviewed the following medical problems during today's office visit>>     HBP> on ASA81, Lisinopril20, HCT25, K20; BP= 130/70 & she feels well w/o CP, palpit, dizzy, SOB, edema, etc...    Ven Insuffic> on low sodium diet + the diuretic; reminded of elevation & support hose too; she denies swelling...    Chol> on Simva20; FLP shows TChol 204, TG 286, HDL 58, LDL 114; not at goals despite wt reduction, therefore incr SIMVA40...    DM> on Metform500Bid & Glimep1mg -1/2 tabQam; BS=138, A1c=6.2; continue same & the wt loss program!    Obesity> wt=225# which is down 18# in the last yr; we reviewed the necessary diet & exercise program for substantial wt reduction...     GI- colon polyps> she had 3 adenomatous polyps removed 11/09 by DrDBrodie & she plans f/u soon...    LBP> she denies back pain but has some left hip discomfort w/ walking; rec OTC analgesics as needed...    Anxiety> on Alprazolam0.5mg  prn... We reviewed prob list, meds, xrays and labs> see below for updates >> OK Flu vaccine & refill meds today... LABS 10/13:  FLP not at goal on Simva20 w/ TG=286 LDL=114;  Chems- ok x BS=138 A1c=6.2 LFTs=sl elev;  CBC- wnl;  TSH=0.81   ~  May 30, 2013:  Yearly ROV & Cynthia [name] says she's had a good yr- no new complaints or concerns; she is now retired;     HBP> on ASA81, Lisinopril20, HCT25, K20; BP= 134/70 & she feels well w/o CP, palpit, dizzy, SOB, edema, etc; still needs to incr exercise!    Ven Insuffic> on low sodium diet + the diuretic; reminded of elevation & support hose; she denies swelling...    Chol> on Simva40 now; FLP 10/14 shows TChol 177, TG 185, HDL 66, LDL 74; this is much improved, but needs better low fat diet...    DM> on Metform500Bid & Glimep1mg -1/2 tabQam; BS=146, A1c=6.1; Stable- continue same & the wt loss program!    Obesity> wt=217# which is down 8#  further in the last yr; we reviewed the necessary diet & exercise program for additional wt reduction...    GI- colon polyps> she had 3 adenomatous polyps removed 11/09 by DrDBrodie; f/u colon 12/13 showed one sm sessile polyp in sigmoid, Bx=hyperplastic, she said f/u 44yrs.    LBP> she denies back pain but has some left hip discomfort w/ walking; rec OTC analgesics as needed...    Anxiety> on Alprazolam0.5mg  prn... We reviewed prob list, meds, xrays and labs> see below for updates >> OK 2014 Flu shot today & 90 day refill Rxs... LABS 10/14:  FLP- ok on Simva40 x TG=185;  Chems- ok w/ BS=146 Ca=10.4 SGOT=40 A1c=6.1;  CBC= wnl;  TSH=0.61...           Problem List:  ASTHMATIC BRONCHITIS, ACUTE (ICD-466.0) - no recent exas...  HYPERTENSION (ICD-401.9) - on ASA 81mg /d,  LISINOPRIL 20mg /d,   HCTZ 25mg /d,  & K20/d... ~  baseline EKG w/ poor R progression & NSSTTWA... ~  Cath in 9/04 was normal- norm coronaries, norm LVF, etc... rec for aggressive primary prevention. ~  10/12:  CXR showed normal heart size, clear lungs but sl low lung vol's;  EKG showed NSR, ?poor R progression, NAD... ~  10/13: on ASA81, Lisinopril20, HCT25, K20; BP= 130/70 & she feels well w/o CP, palpit, dizzy, SOB, edema, etc... ~  10/14: on ASA81, Lisinopril20, HCT25, K20; BP= 134/70 & she remains asymptomatic; still needs to incr exercise!  VENOUS INSUFFICIENCY (ICD-459.81) - she has mild VI & 1+edema... on low sodium diet, elevation, support hose...  HYPERCHOLESTEROLEMIA (ICD-272.0) >>  ~  FLP 6/08 on Simva20 showed TChol 193, TG 205, HDL 56, LDL 108...  ~  FLP 7/09 on Simva20 showed TChol 176, TG 161, HDL 50, LDL 94... keep same med, better diet effort. ~  FLP 10/10 on Simva20 showed TChol 178, TG 190, HDL 51, LDL 89 ~  FLP 10/11 on Simva20 showed TChol 197, TG 213, HDL 58, LDL 120 ~  FLP 10/12 on Simva20 showed TChol 186, TG 292, HDL 59, LDL 101... Needs better low fat diet & wt reduction. ~  FLP 10/13 on Simva20 showed TChol 204, TG 286, HDL 58, LDL 114... Not at goals, rec incr SIMVA40... ~  FLP 10/14 on Simva40 showed TChol 177, TG 185, HDL 66, LDL 74... Same med, better low fat diet.  DIABETES MELLITUS (ICD-250.00) - on diet + METFORMIN 500mg Bid,  GLIMEPIRIDE 1mg -1/2 tab Qam...  ~  labs 6/09 showed BS= 144, HgA1c= 7.4... she was on Metformin once daily- incr to Bid... ~  labs 7/09 showed BS= 138, HgA1c= 6.4.Marland KitchenMarland Kitchen rec better diet & get weight down... ~  labs 10/10 on Metform500Bid+Actos15 showed BS= 131, A1c= 6.6.Marland Kitchen. rec> same meds, better diet, get wt down! ~  labs 10/11 on Metform500Bid+Actos15 showed BS= 123, A1c= 6.6 ~  Labs 10/12 on Metform500Bid+Actos15 showed BS=157, A1c=6.8; Actos is too $$ & we will switch to GLIMEP1mg - 1/2 Qam. ~  Labs 10/13 on Metform500Bid+Glim0.5 showed BS=138, A1c=6.2 ~  Labs  10/14 on Metform500Bid+Glim1mg -1/2tab showed BS=146, A1c=6.1  MORBID OBESITY (ICD-278.01) - we reviewed weight reducing diet & exercise program... ~  8/09: weight = 237# which is down 6# since 2008... discussed diet + exercise... ~  10/10: weight = 248#... reviewed diet + exercise perscription for wt reduction... ~  10/11:  weight = 248# ~  10/12:  Weight = 244# ~  10/13:  Weight = 225# ~  10/14:  Weight = 217#  COLONIC POLYPS (ICD-211.3) -  pt finally had routine screening colonoscopy 11/09 w/ 3 adenomatous polyps removed by DrDBrodie... f/u planned 33yrs... ~  10/12:  DrDBrodie suggested f/u colon in 3 yrs, we will send flag to her to review chart & set this up... ~  Colonoscopy done 12/13 w/ one sm sessile polyp in sigmoid, Bx=hyperplastic, she said f/u 46yrs  BACK PAIN, LUMBAR (ICD-724.2) - s/p lumbar lam in 1977... ~  BMD 10/12 here showed TScores +2.6 in Spine, and -1.4 in left FemNeck; Rec to take calcium, MVI, VitD & wt bearing exercise...  HEADACHE (ICD-784.0) - hx dizziness secondary to hyperventilation in past w/ eval from Neurology...  ANXIETY DISORDER (ICD-300.00) - on ALPRAZOLAM 0.5mg  Prn...  HEALTH MAINTENANCE:  ~  GI:  ?due for f/u colon w/ DrBrodie> we will send inquiry... ~  GYN:  She doesn't currently have a GYN, ?who she saw prev?, encouraged to pick & call for PAP, Mammogram (done [facility] 10/13- neg), BMD (done 10/12 here w/ Tscore -1.4 in left FemNeck; on Ca, MVI, VitD, wt bearing exer)... ~  Immuniz:  She receives the seasonal Flu vaccine each fall...   Past Surgical History  Procedure Laterality Date  . Pelvic abcess drainage  1970    Dr. Merlene Morse  . Lumbar laminectomy  1997    Dr. Roxan Hockey  . Colonoscopy    . Polypectomy    . Carpal tunnel release    . Dilation and curettage of uterus      Outpatient Encounter Prescriptions as of 05/30/2013  Medication Sig Dispense Refill  . aspirin 81 MG tablet Take 81 mg by mouth daily.        . Calcium Carbonate  Antacid (TUMS E-X PO) Take by mouth as needed.      . cholecalciferol (VITAMIN D) 1000 UNITS tablet Take 1,000 Units by mouth daily.      . famotidine (PEPCID) 10 MG tablet Take 10 mg by mouth as needed.      Marland Kitchen glimepiride (AMARYL) 1 MG tablet Take 1/2 tablet daily      . hydrochlorothiazide (HYDRODIURIL) 25 MG tablet TAKE ONE TABLET BY MOUTH DAILY.  90 tablet  0  . lisinopril (PRINIVIL,ZESTRIL) 20 MG tablet Take 1 tablet (20 mg total) by mouth daily.  90 tablet  3  . metFORMIN (GLUCOPHAGE) 500 MG tablet Take 1 tablet (500 mg total) by mouth 2 (two) times daily with a meal.  180 tablet  3  . Multiple Vitamins-Minerals (MULTIPLE VITAMINS/WOMENS PO) Take 1 tablet by mouth daily.        . naproxen sodium (ANAPROX) 220 MG tablet Take 220 mg by mouth 2 (two) times daily with a meal.      . potassium chloride SA (KLOR-CON M20) 20 MEQ tablet Take 1 tablet (20 mEq total) by mouth daily.  90 tablet  3  . simvastatin (ZOCOR) 40 MG tablet Take 1 tablet (40 mg total) by mouth at bedtime.  90 tablet  3  . vitamin B-12 (CYANOCOBALAMIN) 1000 MCG tablet Take 1,000 mcg by mouth daily.      . ferrous sulfate 325 (65 FE) MG tablet Take 325 mg by mouth daily with breakfast.        . [DISCONTINUED] ALPRAZolam (XANAX) 0.5 MG tablet Take 1 tablet (0.5 mg total) by mouth at bedtime as needed.  90 tablet  5   No facility-administered encounter medications on file as of 05/30/2013.    No Known Allergies   Current Medications, Allergies, Past Medical History, Past Surgical History,  Family History, and Social History were reviewed in [employer] record.     Review of Systems        The patient complains of fatigue, dyspnea on exertion, stiffness, and arthritis.  The patient denies fever, chills, sweats, anorexia, weakness, malaise, weight loss, sleep disorder, blurring, diplopia, eye irritation, eye discharge, vision loss, eye pain, photophobia, earache, ear discharge, tinnitus, decreased  hearing, nasal congestion, nosebleeds, sore throat, hoarseness, chest pain, palpitations, syncope, orthopnea, PND, peripheral edema, cough, dyspnea at rest, excessive sputum, hemoptysis, wheezing, pleurisy, nausea, vomiting, diarrhea, constipation, change in bowel habits, abdominal pain, melena, hematochezia, jaundice, gas/bloating, indigestion/heartburn, dysphagia, odynophagia, dysuria, hematuria, urinary frequency, urinary hesitancy, nocturia, incontinence, back pain, joint pain, joint swelling, muscle cramps, muscle weakness, sciatica, restless legs, leg pain at night, leg pain with exertion, rash, itching, dryness, suspicious lesions, paralysis, paresthesias, seizures, tremors, vertigo, transient blindness, frequent falls, frequent headaches, difficulty walking, depression, anxiety, memory loss, confusion, cold intolerance, heat intolerance, polydipsia, polyphagia, polyuria, unusual weight change, abnormal bruising, bleeding, enlarged lymph nodes, urticaria, allergic rash, hay fever, and recurrent infections.     Objective:   Physical Exam     WD, Obese, 67 y/o WF in NAD... GENERAL:  Alert & oriented; pleasant & cooperative... HEENT:  [place]/AT, EOM-wnl, PERRLA, EACs-clear, TMs-wnl, NOSE-clear, THROAT-clear & wnl. NECK:  Supple w/ fairROM; no JVD; normal carotid impulses w/o bruits; no thyromegaly or nodules palpated; no lymphadenopathy. CHEST:  Clear to P & A; without wheezes/ rales/ or rhonchi. HEART:  Regular Rhythm; without murmurs/ rubs/ or gallops. ABDOMEN:  Obese, soft & nontender w/ large panniculus; normal bowel sounds; no organomegaly or masses detected. EXT: without deformities, mild arthritic changes; no varicose veins/ +venous insuffic/ tr edema... NEURO:  CN's intact;  no focal neuro deficits... DERM:  no rash, no lesions noted...  RADIOLOGY DATA:  Reviewed in the EPIC EMR & discussed w/ the patient...  LABORATORY DATA:  Reviewed in the EPIC EMR & discussed w/ the  patient...   Assessment & Plan:    HBP>  Controlled on her meds but needs better diet & wt reduction...  Ven Insuffic>  On sodium restriction, elevation, support hose;  She has HCT as well helping the edema...  CHOL>  On Simva40 now & FLP improved; TG still elev & we reviewed low fat diet...  DM>  Her A1c remains in the 6's;  She will watch BS at home in light of her A1c=6.1 & current meds...  Obesity>  BMI in the 40's; she must diet, exercise, get the wt down...  Hx colon polyps>  F/u colon done 12/13 w/ one sm hyperplastic polyp removed; DrBrodie said f/u 10 yrs...  Hx LBP>  She had LLam in 1977; uses OTC analgesics as needed...  Anxiety>  She has Alpraz for prn use...   Patient's Medications  New Prescriptions   No medications on file  Previous Medications   ASPIRIN 81 MG TABLET    Take 81 mg by mouth daily.     CALCIUM CARBONATE ANTACID (TUMS E-X PO)    Take by mouth as needed.   CHOLECALCIFEROL (VITAMIN D) 1000 UNITS TABLET    Take 1,000 Units by mouth daily.   FAMOTIDINE (PEPCID) 10 MG TABLET    Take 10 mg by mouth as needed.   FERROUS SULFATE 325 (65 FE) MG TABLET    Take 325 mg by mouth daily with breakfast.     GLIMEPIRIDE (AMARYL) 1 MG TABLET    Take 1/2 tablet daily  MULTIPLE VITAMINS-MINERALS (MULTIPLE VITAMINS/WOMENS PO)    Take 1 tablet by mouth daily.     NAPROXEN SODIUM (ANAPROX) 220 MG TABLET    Take 220 mg by mouth 2 (two) times daily with a meal.   VITAMIN B-12 (CYANOCOBALAMIN) 1000 MCG TABLET    Take 1,000 mcg by mouth daily.  Modified Medications   Modified Medication Previous Medication   HYDROCHLOROTHIAZIDE (HYDRODIURIL) 25 MG TABLET hydrochlorothiazide (HYDRODIURIL) 25 MG tablet      TAKE ONE TABLET BY MOUTH DAILY.    TAKE ONE TABLET BY MOUTH DAILY.   LISINOPRIL (PRINIVIL,ZESTRIL) 20 MG TABLET lisinopril (PRINIVIL,ZESTRIL) 20 MG tablet      Take 1 tablet (20 mg total) by mouth daily.    Take 1 tablet (20 mg total) by mouth daily.   METFORMIN  (GLUCOPHAGE) 500 MG TABLET metFORMIN (GLUCOPHAGE) 500 MG tablet      Take 1 tablet (500 mg total) by mouth 2 (two) times daily with a meal.    Take 1 tablet (500 mg total) by mouth 2 (two) times daily with a meal.   POTASSIUM CHLORIDE SA (KLOR-CON M20) 20 MEQ TABLET potassium chloride SA (KLOR-CON M20) 20 MEQ tablet      Take 1 tablet (20 mEq total) by mouth daily.    Take 1 tablet (20 mEq total) by mouth daily.   SIMVASTATIN (ZOCOR) 40 MG TABLET simvastatin (ZOCOR) 40 MG tablet      Take 1 tablet (40 mg total) by mouth at bedtime.    Take 1 tablet (40 mg total) by mouth at bedtime.  Discontinued Medications   ALPRAZOLAM (XANAX) 0.5 MG TABLET    Take 1 tablet (0.5 mg total) by mouth at bedtime as needed.

## 2013-05-30 NOTE — Patient Instructions (Signed)
Today we updated your med list in our EPIC system...    Continue your current medications the same...  CFongrats on your weight reduction and improved numbers...    Keep up the good work...  We gave you the 2014 Flu vaccine today...  Call for any questions...  Let's plan a follow up visit in 81yr, sooner if needed for problems.Marland KitchenMarland Kitchen

## 2013-05-31 ENCOUNTER — Ambulatory Visit: Payer: Managed Care, Other (non HMO) | Admitting: Pulmonary Disease

## 2013-06-13 ENCOUNTER — Telehealth: Payer: Self-pay | Admitting: Pulmonary Disease

## 2013-06-13 NOTE — Telephone Encounter (Signed)
I spoke with pt. She is looking to get the PNA and tetnus vaccine. Her last PNA vaccine was 05/2009 and had tetnus in 2005-2006. PLease advise SN thanks

## 2013-06-13 NOTE — Telephone Encounter (Signed)
Pt placed on injection schedule. Pt aware that appt is 11 with the allergy lab.

## 2013-06-13 NOTE — Telephone Encounter (Signed)
Per SN-okay to allow patient to come in for PNA and Tdap shots without OV with SN.

## 2013-06-14 ENCOUNTER — Ambulatory Visit (INDEPENDENT_AMBULATORY_CARE_PROVIDER_SITE_OTHER): Payer: Medicare Other

## 2013-06-14 DIAGNOSIS — Z23 Encounter for immunization: Secondary | ICD-10-CM

## 2014-05-16 ENCOUNTER — Other Ambulatory Visit: Payer: Self-pay

## 2014-05-16 ENCOUNTER — Telehealth: Payer: Self-pay | Admitting: Pulmonary Disease

## 2014-05-16 DIAGNOSIS — M810 Age-related osteoporosis without current pathological fracture: Secondary | ICD-10-CM

## 2014-05-16 DIAGNOSIS — D126 Benign neoplasm of colon, unspecified: Secondary | ICD-10-CM

## 2014-05-16 DIAGNOSIS — E1110 Type 2 diabetes mellitus with ketoacidosis without coma: Secondary | ICD-10-CM

## 2014-05-16 DIAGNOSIS — F411 Generalized anxiety disorder: Secondary | ICD-10-CM

## 2014-05-16 DIAGNOSIS — E7800 Pure hypercholesterolemia, unspecified: Secondary | ICD-10-CM

## 2014-05-16 DIAGNOSIS — I10 Essential (primary) hypertension: Secondary | ICD-10-CM

## 2014-05-16 NOTE — Telephone Encounter (Signed)
Called and spoke with pt and she is aware of labs in the computer and she will come in prior to her appt for these.

## 2014-05-22 ENCOUNTER — Other Ambulatory Visit (INDEPENDENT_AMBULATORY_CARE_PROVIDER_SITE_OTHER): Payer: Medicare Other

## 2014-05-22 DIAGNOSIS — D126 Benign neoplasm of colon, unspecified: Secondary | ICD-10-CM

## 2014-05-22 DIAGNOSIS — E7800 Pure hypercholesterolemia, unspecified: Secondary | ICD-10-CM

## 2014-05-22 DIAGNOSIS — M810 Age-related osteoporosis without current pathological fracture: Secondary | ICD-10-CM

## 2014-05-22 DIAGNOSIS — E1310 Other specified diabetes mellitus with ketoacidosis without coma: Secondary | ICD-10-CM

## 2014-05-22 DIAGNOSIS — I10 Essential (primary) hypertension: Secondary | ICD-10-CM

## 2014-05-22 DIAGNOSIS — E1110 Type 2 diabetes mellitus with ketoacidosis without coma: Secondary | ICD-10-CM

## 2014-05-22 DIAGNOSIS — F411 Generalized anxiety disorder: Secondary | ICD-10-CM

## 2014-05-22 LAB — HEPATIC FUNCTION PANEL
ALK PHOS: 54 U/L (ref 39–117)
ALT: 40 U/L — ABNORMAL HIGH (ref 0–35)
AST: 55 U/L — ABNORMAL HIGH (ref 0–37)
Albumin: 3.9 g/dL (ref 3.5–5.2)
BILIRUBIN DIRECT: 0.1 mg/dL (ref 0.0–0.3)
Total Bilirubin: 0.9 mg/dL (ref 0.2–1.2)
Total Protein: 7.9 g/dL (ref 6.0–8.3)

## 2014-05-22 LAB — CBC WITH DIFFERENTIAL/PLATELET
Basophils Absolute: 0 10*3/uL (ref 0.0–0.1)
Basophils Relative: 0.3 % (ref 0.0–3.0)
Eosinophils Absolute: 0.1 10*3/uL (ref 0.0–0.7)
Eosinophils Relative: 1.2 % (ref 0.0–5.0)
HCT: 39.4 % (ref 36.0–46.0)
Hemoglobin: 13.3 g/dL (ref 12.0–15.0)
Lymphocytes Relative: 19.9 % (ref 12.0–46.0)
Lymphs Abs: 1.5 10*3/uL (ref 0.7–4.0)
MCHC: 33.7 g/dL (ref 30.0–36.0)
MCV: 90.3 fl (ref 78.0–100.0)
Monocytes Absolute: 0.6 10*3/uL (ref 0.1–1.0)
Monocytes Relative: 8.8 % (ref 3.0–12.0)
Neutro Abs: 5.1 10*3/uL (ref 1.4–7.7)
Neutrophils Relative %: 69.8 % (ref 43.0–77.0)
Platelets: 257 10*3/uL (ref 150.0–400.0)
RBC: 4.36 Mil/uL (ref 3.87–5.11)
RDW: 14.4 % (ref 11.5–15.5)
WBC: 7.3 10*3/uL (ref 4.0–10.5)

## 2014-05-22 LAB — BASIC METABOLIC PANEL WITH GFR
BUN: 10 mg/dL (ref 6–23)
CO2: 26 meq/L (ref 19–32)
Calcium: 10.7 mg/dL — ABNORMAL HIGH (ref 8.4–10.5)
Chloride: 92 meq/L — ABNORMAL LOW (ref 96–112)
Creatinine, Ser: 0.8 mg/dL (ref 0.4–1.2)
GFR: 76.19 mL/min
Glucose, Bld: 121 mg/dL — ABNORMAL HIGH (ref 70–99)
Potassium: 4.4 meq/L (ref 3.5–5.1)
Sodium: 130 meq/L — ABNORMAL LOW (ref 135–145)

## 2014-05-22 LAB — LIPID PANEL
Cholesterol: 155 mg/dL (ref 0–200)
HDL: 60.6 mg/dL
LDL Cholesterol: 54 mg/dL (ref 0–99)
NonHDL: 94.4
Total CHOL/HDL Ratio: 3
Triglycerides: 200 mg/dL — ABNORMAL HIGH (ref 0.0–149.0)
VLDL: 40 mg/dL (ref 0.0–40.0)

## 2014-05-22 LAB — VITAMIN D 25 HYDROXY (VIT D DEFICIENCY, FRACTURES): VITD: 33.04 ng/mL (ref 30.00–100.00)

## 2014-05-22 LAB — HEMOGLOBIN A1C: Hgb A1c MFr Bld: 6.1 % (ref 4.6–6.5)

## 2014-05-22 LAB — TSH: TSH: 0.69 u[IU]/mL (ref 0.35–4.50)

## 2014-05-30 ENCOUNTER — Ambulatory Visit (INDEPENDENT_AMBULATORY_CARE_PROVIDER_SITE_OTHER): Payer: Medicare Other | Admitting: Pulmonary Disease

## 2014-05-30 ENCOUNTER — Encounter: Payer: Self-pay | Admitting: Pulmonary Disease

## 2014-05-30 ENCOUNTER — Ambulatory Visit (INDEPENDENT_AMBULATORY_CARE_PROVIDER_SITE_OTHER)
Admission: RE | Admit: 2014-05-30 | Discharge: 2014-05-30 | Disposition: A | Discharge status: Home / Self Care | Payer: Medicare Other | Source: Ambulatory Visit | Attending: Pulmonary Disease | Admitting: Pulmonary Disease

## 2014-05-30 VITALS — BP 128/80 | HR 83 | Temp 98.1°F | Ht 61.0 in | Wt 221.2 lb

## 2014-05-30 DIAGNOSIS — M545 Low back pain, unspecified: Secondary | ICD-10-CM

## 2014-05-30 DIAGNOSIS — I10 Essential (primary) hypertension: Secondary | ICD-10-CM

## 2014-05-30 DIAGNOSIS — I872 Venous insufficiency (chronic) (peripheral): Secondary | ICD-10-CM

## 2014-05-30 DIAGNOSIS — E669 Obesity, unspecified: Secondary | ICD-10-CM

## 2014-05-30 DIAGNOSIS — E7800 Pure hypercholesterolemia, unspecified: Secondary | ICD-10-CM

## 2014-05-30 DIAGNOSIS — M159 Polyosteoarthritis, unspecified: Secondary | ICD-10-CM

## 2014-05-30 DIAGNOSIS — M150 Primary generalized (osteo)arthritis: Secondary | ICD-10-CM

## 2014-05-30 DIAGNOSIS — E119 Type 2 diabetes mellitus without complications: Secondary | ICD-10-CM | POA: Insufficient documentation

## 2014-05-30 DIAGNOSIS — D126 Benign neoplasm of colon, unspecified: Secondary | ICD-10-CM

## 2014-05-30 MED ORDER — LISINOPRIL 20 MG PO TABS: 20.0000 mg | ORAL_TABLET | Freq: Every day | ORAL | Status: DC

## 2014-05-30 MED ORDER — HYDROCHLOROTHIAZIDE 25 MG PO TABS: ORAL_TABLET | ORAL | Status: DC

## 2014-05-30 MED ORDER — METFORMIN HCL 500 MG PO TABS: 500.0000 mg | ORAL_TABLET | Freq: Two times a day (BID) | ORAL | Status: DC

## 2014-05-30 MED ORDER — POTASSIUM CHLORIDE CRYS ER 20 MEQ PO TBCR: 20.0000 meq | EXTENDED_RELEASE_TABLET | Freq: Every day | ORAL | Status: DC

## 2014-05-30 MED ORDER — SIMVASTATIN 40 MG PO TABS: 40.0000 mg | ORAL_TABLET | Freq: Every day | ORAL | Status: DC

## 2014-05-30 NOTE — Progress Notes (Signed)
Subjective:    Patient ID: Cynthia [name], female    DOB: [birth year], 68 y.o.   MRN: [id number]  HPI 68 y/o WF here for a follow up visit... she has multiple medical problems as noted below...  ~  SEE PREV EPIC NOTES FOR OLDER DATA >>   ~  May 31, 2012:  Yearly ROV & CPX> Cynthia [name] reports a good year & has no new complaints or concerns; she has done better w/ her diet & exercise program & wt is down 18# to 225# today... We reviewed the following medical problems during today's office visit>>     HBP> on ASA81, Lisinopril20, HCT25, K20; BP= 130/70 & she feels well w/o CP, palpit, dizzy, SOB, edema, etc...    Ven Insuffic> on low sodium diet + the diuretic; reminded of elevation & support hose too; she denies swelling...    Chol> on Simva20; FLP shows TChol 204, TG 286, HDL 58, LDL 114; not at goals despite wt reduction, therefore incr SIMVA40...    DM> on Metform500Bid & Glimep1mg -1/2 tabQam; BS=138, A1c=6.2; continue same & the wt loss program!    Obesity> wt=225# which is down 18# in the last yr; we reviewed the necessary diet & exercise program for substantial wt reduction...    GI- colon polyps> she had 3 adenomatous polyps removed 11/09 by DrDBrodie & she plans f/u soon...    LBP> she denies back pain but has some left hip discomfort w/ walking; rec OTC analgesics as needed...    Anxiety> on Alprazolam0.5mg  prn... We reviewed prob list, meds, xrays and labs> see below for updates >> OK Flu vaccine & refill meds today... LABS 10/13:  FLP not at goal on Simva20 w/ TG=286 LDL=114;  Chems- ok x BS=138 A1c=6.2 LFTs=sl elev;  CBC- wnl;  TSH=0.81   ~  May 30, 2013:  Yearly [place] says she's had a good yr- no new complaints or concerns; she is now retired;     HBP> on ASA81, Lisinopril20, HCT25, K20; BP= 134/70 & she feels well w/o CP, palpit, dizzy, SOB, edema, etc; still needs to incr exercise!    Ven Insuffic> on low sodium diet + the diuretic; reminded of elevation & support hose; she  denies swelling...    Chol> on Simva40 now; FLP 10/14 shows TChol 177, TG 185, HDL 66, LDL 74; this is much improved, but needs better low fat diet...    DM> on Metform500Bid & Glimep1mg -1/2 tabQam; BS=146, A1c=6.1; Stable- continue same & the wt loss program!    Obesity> wt=217# which is down 8# further in the last yr; we reviewed the necessary diet & exercise program for additional wt reduction...    GI- colon polyps> she had 3 adenomatous polyps removed 11/09 by DrDBrodie; f/u colon 12/13 showed one sm sessile polyp in sigmoid, Bx=hyperplastic, she said f/u 11yrs.    LBP> she denies back pain but has some left hip discomfort w/ walking; rec OTC analgesics as needed...    Anxiety> on Alprazolam0.5mg  prn... We reviewed prob list, meds, xrays and labs> see below for updates >> OK 2014 Flu shot today & 90 day refill Rxs... LABS 10/14:  FLP- ok on Simva40 x TG=185;  Chems- ok w/ BS=146 Ca=10.4 SGOT=40 A1c=6.1;  CBC= wnl;  TSH=0.61...  ~  May 30, 2014:  Yearly ROV & Cynthia [name] tells me that she has a trigger finger on her left hand & may need surg per DrGramig; We reviewed the following medical problems during today's office  visit >>     HBP> on ASA81, Lisinopril20, HCT25, K20; BP= 128/80 & she feels well w/o CP, palpit, dizzy, SOB, edema, etc; still needs diet & exercise!    Ven Insuffic> on low sodium diet + the diuretic; reminded of elevation & support hose; she denies swelling...    Chol> on Simva40; FLP 10/15 shows TChol 155, TG 200, HDL 61, LDL 54; she needs better low fat diet & wt reduction...    DM> on Metform500Bid & off Glimep; Labs 10/15 showed BS=121, A1c=6.1; Stable- continue same & the wt loss program!    Obesity> wt=221# which is up 4# this past yr; we reviewed the necessary diet & exercise program to be successful w/ wt reduction...    GI- colon polyps> she had 3 adenomatous polyps removed 11/09 by DrDBrodie; f/u colon 12/13 showed one sm sessile polyp in sigmoid, Bx=hyperplastic, she  said f/u 56yrs.    LBP> she denies back pain but has some left hip discomfort w/ walking; rec OTC analgesics as needed...    Anxiety> on Alprazolam0.5mg  prn... We reviewed prob list, meds, xrays and labs> see below for updates >> she had the 2015 Flu vaccine 9/15; given refills per request...   CXR 10/15 showed norm heart size, clear lungs, NAD.Marland KitchenMarland Kitchen  EKG 10/15 showed NSR, rate 78, low voltage & poor r progression...   LABS 10/15:  FLP- Chol is ok but TG=200 & needs wt reduction;  Chems- ok x BS=121 A1c=6.1;  CBC- wnl;  TSH=0.69...           Problem List:  ASTHMATIC BRONCHITIS, ACUTE (ICD-466.0) - no recent exas... ~  CXR 10/15 showed norm heart size, clear lungs, NAD...  HYPERTENSION (ICD-401.9) - on ASA 81mg /d,  LISINOPRIL 20mg /d,  HCTZ 25mg /d,  & K20/d... ~  baseline EKG w/ poor R progression & NSSTTWA... ~  Cath in 9/04 was normal- norm coronaries, norm LVF, etc... rec for aggressive primary prevention. ~  10/12:  CXR showed normal heart size, clear lungs but sl low lung vol's;  EKG showed NSR, ?poor R progression, NAD... ~  10/13: on ASA81, Lisinopril20, HCT25, K20; BP= 130/70 & she feels well w/o CP, palpit, dizzy, SOB, edema, etc... ~  10/14: on ASA81, Lisinopril20, HCT25, K20; BP= 134/70 & she remains asymptomatic; still needs to incr exercise! ~  10/15: on ASA81, Lisinopril20, HCT25, K20; BP= 128/80 & she feels well w/o CP, palpit, dizzy, SOB, edema, etc; still needs diet & exercise! ~  EKG 10/15 showed NSR, rate 78, low voltage & poor r progression...   VENOUS INSUFFICIENCY (ICD-459.81) - she has mild VI & 1+edema... on low sodium diet, elevation, support hose...  HYPERCHOLESTEROLEMIA (ICD-272.0) >>  ~  FLP 6/08 on Simva20 showed TChol 193, TG 205, HDL 56, LDL 108...  ~  [place] 7/09 on Simva20 showed TChol 176, TG 161, HDL 50, LDL 94... keep same med, better diet effort. ~  FLP 10/10 on Simva20 showed TChol 178, TG 190, HDL 51, LDL 89 ~  FLP 10/11 on Simva20 showed TChol 197, TG  213, HDL 58, LDL 120 ~  FLP 10/12 on Simva20 showed TChol 186, TG 292, HDL 59, LDL 101... Needs better low fat diet & wt reduction. ~  FLP 10/13 on Simva20 showed TChol 204, TG 286, HDL 58, LDL 114... Not at goals, rec incr SIMVA40... ~  FLP 10/14 on Simva40 showed TChol 177, TG 185, HDL 66, LDL 74... Same med, better low fat diet. ~  FLP 10/15 on Simva40  showed TChol 155, TG 200, HDL 61, LDL 54... Chol ok but elev TG needs better low fat diet & wt reduction...  DIABETES MELLITUS (ICD-250.00) - on diet + METFORMIN 500mg Bid,  GLIMEPIRIDE 1mg -1/2 tab Qam...  ~  labs 6/09 showed BS= 144, HgA1c= 7.4... she was on Metformin once daily- incr to Bid... ~  labs 7/09 showed BS= 138, HgA1c= 6.4.Marland KitchenMarland Kitchen rec better diet & get weight down... ~  labs 10/10 on Metform500Bid+Actos15 showed BS= 131, A1c= 6.6.Marland Kitchen. rec> same meds, better diet, get wt down! ~  labs 10/11 on Metform500Bid+Actos15 showed BS= 123, A1c= 6.6 ~  Labs 10/12 on Metform500Bid+Actos15 showed BS=157, A1c=6.8; Actos is too $$ & we will switch to GLIMEP1mg - 1/2 Qam. ~  Labs 10/13 on Metform500Bid+Glim0.5 showed BS=138, A1c=6.2 ~  Labs 10/14 on Metform500Bid+Glim1mg -1/2tab showed BS=146, A1c=6.1 ~  Labs 10/15 on Metform500Bid showed BS=121, A1c=6.1  MORBID OBESITY (ICD-278.01) - we reviewed weight reducing diet & exercise program... ~  8/09: weight = 237# which is down 6# since 2008... discussed diet + exercise... ~  10/10: weight = 248#... reviewed diet + exercise perscription for wt reduction... ~  10/11:  weight = 248# ~  10/12:  Weight = 244# ~  10/13:  Weight = 225# ~  10/14:  Weight = 217# ~  10/15:  Weight = 221#  COLONIC POLYPS (ICD-211.3) - pt finally had routine screening colonoscopy 11/09 w/ 3 adenomatous polyps removed by DrDBrodie... f/u planned 68yrs... ~  10/12:  DrDBrodie suggested f/u colon in 3 yrs, we will send flag to her to review chart & set this up... ~  Colonoscopy done 12/13 w/ one sm sessile polyp in sigmoid,  Bx=hyperplastic, she said f/u 35yrs  ORTHO >> she had right wrist CTS surg by DrGramig 2013 ~  10/15: she reports trigger finger & may need surg by DrGramig...  BACK PAIN, LUMBAR (ICD-724.2) - s/p lumbar lam in 1977... ~  BMD 10/12 here showed TScores +2.6 in Spine, and -1.4 in left FemNeck; Rec to take calcium, MVI, VitD & wt bearing exercise...  HEADACHE (ICD-784.0) - hx dizziness secondary to hyperventilation in past w/ eval from Neurology...  ANXIETY DISORDER (ICD-300.00) - on ALPRAZOLAM 0.5mg  Prn...  HEALTH MAINTENANCE:  ~  GI:  ?due for f/u colon w/ DrBrodie> we will send inquiry... ~  GYN:  She doesn't currently have a GYN, ?who she saw prev?, encouraged to pick & call for PAP, Mammogram (done [facility] 10/13- neg), BMD (done 10/12 here w/ Tscore -1.4 in left FemNeck; on Ca, MVI, VitD, wt bearing exer)... ~  Immuniz:  She receives the seasonal Flu vaccine each fall...   Past Surgical History  Procedure Laterality Date  . Pelvic abcess drainage  1970    Dr. Marjory Sneddon  . Lumbar laminectomy  1997    Dr. Quentin Cornwall  . Colonoscopy    . Polypectomy    . Carpal tunnel release    . Dilation and curettage of uterus      Outpatient Encounter Prescriptions as of 05/30/2014  Medication Sig  . aspirin 81 MG tablet Take 81 mg by mouth daily.    . Calcium Carbonate Antacid (TUMS E-X PO) Take by mouth as needed.  . cholecalciferol (VITAMIN D) 1000 UNITS tablet Take 1,000 Units by mouth daily.  . famotidine (PEPCID) 10 MG tablet Take 10 mg by mouth as needed.  . ferrous sulfate 325 (65 FE) MG tablet Take 325 mg by mouth daily with breakfast.    . glimepiride (AMARYL) 1  MG tablet Take 1/2 tablet daily  . hydrochlorothiazide (HYDRODIURIL) 25 MG tablet TAKE ONE TABLET BY MOUTH DAILY.  Marland Kitchen lisinopril (PRINIVIL,ZESTRIL) 20 MG tablet Take 1 tablet (20 mg total) by mouth daily.  . metFORMIN (GLUCOPHAGE) 500 MG tablet Take 1 tablet (500 mg total) by mouth 2 (two) times daily with a meal.  . Multiple  Vitamins-Minerals (MULTIPLE VITAMINS/WOMENS PO) Take 1 tablet by mouth daily.    . naproxen sodium (ANAPROX) 220 MG tablet Take 220 mg by mouth 2 (two) times daily with a meal.  . potassium chloride SA (KLOR-CON M20) 20 MEQ tablet Take 1 tablet (20 mEq total) by mouth daily.  . simvastatin (ZOCOR) 40 MG tablet Take 1 tablet (40 mg total) by mouth at bedtime.  . vitamin B-12 (CYANOCOBALAMIN) 1000 MCG tablet Take 1,000 mcg by mouth daily.    No Known Allergies   Current Medications, Allergies, Past Medical History, Past Surgical History, Family History, and Social History were reviewed in [employer] record.     Review of Systems        The patient complains of fatigue, dyspnea on exertion, stiffness, and arthritis.  The patient denies fever, chills, sweats, anorexia, weakness, malaise, weight loss, sleep disorder, blurring, diplopia, eye irritation, eye discharge, vision loss, eye pain, photophobia, earache, ear discharge, tinnitus, decreased hearing, nasal congestion, nosebleeds, sore throat, hoarseness, chest pain, palpitations, syncope, orthopnea, PND, peripheral edema, cough, dyspnea at rest, excessive sputum, hemoptysis, wheezing, pleurisy, nausea, vomiting, diarrhea, constipation, change in bowel habits, abdominal pain, melena, hematochezia, jaundice, gas/bloating, indigestion/heartburn, dysphagia, odynophagia, dysuria, hematuria, urinary frequency, urinary hesitancy, nocturia, incontinence, back pain, joint pain, joint swelling, muscle cramps, muscle weakness, sciatica, restless legs, leg pain at night, leg pain with exertion, rash, itching, dryness, suspicious lesions, paralysis, paresthesias, seizures, tremors, vertigo, transient blindness, frequent falls, frequent headaches, difficulty walking, depression, anxiety, memory loss, confusion, cold intolerance, heat intolerance, polydipsia, polyphagia, polyuria, unusual weight change, abnormal bruising, bleeding, enlarged  lymph nodes, urticaria, allergic rash, hay fever, and recurrent infections.     Objective:   Physical Exam     WD, Obese, 68 y/o WF in NAD... GENERAL:  Alert & oriented; pleasant & cooperative... HEENT:  [place]/AT, EOM-wnl, PERRLA, EACs-clear, TMs-wnl, NOSE-clear, THROAT-clear & wnl. NECK:  Supple w/ fairROM; no JVD; normal carotid impulses w/o bruits; no thyromegaly or nodules palpated; no lymphadenopathy. CHEST:  Clear to P & A; without wheezes/ rales/ or rhonchi. HEART:  Regular Rhythm; without murmurs/ rubs/ or gallops. ABDOMEN:  Obese, soft & nontender w/ large panniculus; normal bowel sounds; no organomegaly or masses detected. EXT: without deformities, mild arthritic changes; no varicose veins/ +venous insuffic/ tr edema... NEURO:  CN's intact;  no focal neuro deficits... DERM:  no rash, no lesions noted...  RADIOLOGY DATA:  Reviewed in the EPIC EMR & discussed w/ the patient...  LABORATORY DATA:  Reviewed in the EPIC EMR & discussed w/ the patient...   Assessment & Plan:    HBP>  Controlled on her meds but needs better diet & wt reduction...  Ven Insuffic>  On sodium restriction, elevation, support hose;  She has HCT as well helping the edema...  CHOL>  On Simva40 now & FLP improved; TG still elev & we reviewed low fat diet...  DM>  Her A1c remains in the 6's;  She will watch BS at home in light of her A1c=6.1 & current meds...  Obesity>  BMI in the 40's; she must diet, exercise, get the wt down...  Hx colon polyps>  F/u colon done 12/13 w/ one sm hyperplastic polyp removed; DrBrodie said f/u 10 yrs...  Hx LBP>  She had LLam in 1977; uses OTC analgesics as needed...  Anxiety>  She has Alpraz for prn use...   Patient's Medications  New Prescriptions   No medications on file  Previous Medications   ASPIRIN 81 MG TABLET    Take 81 mg by mouth daily.     CALCIUM CARBONATE ANTACID (TUMS E-X PO)    Take by mouth as needed.   CHOLECALCIFEROL (VITAMIN D) 1000 UNITS TABLET     Take 1,000 Units by mouth daily.   FAMOTIDINE (PEPCID) 10 MG TABLET    Take 10 mg by mouth as needed.   FERROUS SULFATE 325 (65 FE) MG TABLET    Take 325 mg by mouth daily with breakfast.     GLIMEPIRIDE (AMARYL) 1 MG TABLET    Take 1/2 tablet daily   MULTIPLE VITAMINS-MINERALS (MULTIPLE VITAMINS/WOMENS PO)    Take 1 tablet by mouth daily.     NAPROXEN SODIUM (ANAPROX) 220 MG TABLET    Take 220 mg by mouth 2 (two) times daily with a meal.   VITAMIN B-12 (CYANOCOBALAMIN) 1000 MCG TABLET    Take 1,000 mcg by mouth daily.  Modified Medications   Modified Medication Previous Medication   HYDROCHLOROTHIAZIDE (HYDRODIURIL) 25 MG TABLET hydrochlorothiazide (HYDRODIURIL) 25 MG tablet      TAKE 1/2  TABLET BY MOUTH DAILY.    TAKE ONE TABLET BY MOUTH DAILY.   LISINOPRIL (PRINIVIL,ZESTRIL) 20 MG TABLET lisinopril (PRINIVIL,ZESTRIL) 20 MG tablet      Take 1 tablet (20 mg total) by mouth daily.    Take 1 tablet (20 mg total) by mouth daily.   METFORMIN (GLUCOPHAGE) 500 MG TABLET metFORMIN (GLUCOPHAGE) 500 MG tablet      Take 1 tablet (500 mg total) by mouth 2 (two) times daily with a meal.    Take 1 tablet (500 mg total) by mouth 2 (two) times daily with a meal.   POTASSIUM CHLORIDE SA (KLOR-CON M20) 20 MEQ TABLET potassium chloride SA (KLOR-CON M20) 20 MEQ tablet      Take 1 tablet (20 mEq total) by mouth daily.    Take 1 tablet (20 mEq total) by mouth daily.   SIMVASTATIN (ZOCOR) 40 MG TABLET simvastatin (ZOCOR) 40 MG tablet      Take 1 tablet (40 mg total) by mouth at bedtime.    Take 1 tablet (40 mg total) by mouth at bedtime.  Discontinued Medications   HYDROCHLOROTHIAZIDE (HYDRODIURIL) 25 MG TABLET    TAKE 1/2  TABLET BY MOUTH DAILY.

## 2014-05-30 NOTE — Patient Instructions (Signed)
Today we updated your med list in our EPIC system...     We decided to decrease the HCTZ (Hydrochlorthioazide) 25mg  tabs to 1/2 tab each AM...    Continue your other medications the same...  We reviewed your recent FASTING blood work 7 gave you a copy for your records...  Today we did your follow up CXR & EKG...    We will contact you w/ the results when available...   Keep up the good work w/ DIET & EXERCISE program...    I recommend a LOW CARB diet & you can look up "the glycemic index" on the internet>       Avoid foods w/ a high glycemic index!!!  Call for any questions...  Let's plan a follow up visit in 11yr, sooner if needed for problems.Marland KitchenMarland Kitchen

## 2014-06-28 ENCOUNTER — Other Ambulatory Visit: Payer: Self-pay | Admitting: Pulmonary Disease

## 2014-07-03 LAB — HM DIABETES EYE EXAM

## 2014-08-01 HISTORY — PX: CATARACT EXTRACTION: SUR2

## 2014-08-04 ENCOUNTER — Other Ambulatory Visit: Payer: Self-pay | Admitting: Pulmonary Disease

## 2014-09-09 DIAGNOSIS — H2511 Age-related nuclear cataract, right eye: Secondary | ICD-10-CM | POA: Diagnosis not present

## 2014-09-09 DIAGNOSIS — H18412 Arcus senilis, left eye: Secondary | ICD-10-CM | POA: Diagnosis not present

## 2014-09-09 DIAGNOSIS — H02839 Dermatochalasis of unspecified eye, unspecified eyelid: Secondary | ICD-10-CM | POA: Diagnosis not present

## 2014-09-09 DIAGNOSIS — H18411 Arcus senilis, right eye: Secondary | ICD-10-CM | POA: Diagnosis not present

## 2014-09-20 ENCOUNTER — Other Ambulatory Visit: Payer: Self-pay | Admitting: Pulmonary Disease

## 2014-10-17 DIAGNOSIS — H2512 Age-related nuclear cataract, left eye: Secondary | ICD-10-CM | POA: Diagnosis not present

## 2014-10-17 DIAGNOSIS — H25811 Combined forms of age-related cataract, right eye: Secondary | ICD-10-CM | POA: Diagnosis not present

## 2014-10-17 DIAGNOSIS — H2511 Age-related nuclear cataract, right eye: Secondary | ICD-10-CM | POA: Diagnosis not present

## 2014-10-20 ENCOUNTER — Encounter: Payer: Self-pay | Admitting: Pulmonary Disease

## 2014-10-24 DIAGNOSIS — H2513 Age-related nuclear cataract, bilateral: Secondary | ICD-10-CM | POA: Diagnosis not present

## 2014-11-03 DIAGNOSIS — H2512 Age-related nuclear cataract, left eye: Secondary | ICD-10-CM | POA: Diagnosis not present

## 2014-11-03 DIAGNOSIS — H25812 Combined forms of age-related cataract, left eye: Secondary | ICD-10-CM | POA: Diagnosis not present

## 2014-11-10 DIAGNOSIS — H2513 Age-related nuclear cataract, bilateral: Secondary | ICD-10-CM | POA: Diagnosis not present

## 2014-12-20 ENCOUNTER — Other Ambulatory Visit: Payer: Self-pay | Admitting: Pulmonary Disease

## 2015-01-26 ENCOUNTER — Other Ambulatory Visit: Payer: Self-pay

## 2015-02-04 ENCOUNTER — Encounter: Payer: Self-pay | Admitting: Internal Medicine

## 2015-02-14 ENCOUNTER — Other Ambulatory Visit: Payer: Self-pay | Admitting: Pulmonary Disease

## 2015-03-21 ENCOUNTER — Other Ambulatory Visit: Payer: Self-pay | Admitting: Pulmonary Disease

## 2015-05-20 ENCOUNTER — Telehealth: Payer: Self-pay | Admitting: Pulmonary Disease

## 2015-05-20 DIAGNOSIS — I10 Essential (primary) hypertension: Secondary | ICD-10-CM

## 2015-05-20 DIAGNOSIS — M150 Primary generalized (osteo)arthritis: Secondary | ICD-10-CM

## 2015-05-20 DIAGNOSIS — D126 Benign neoplasm of colon, unspecified: Secondary | ICD-10-CM

## 2015-05-20 DIAGNOSIS — E7800 Pure hypercholesterolemia, unspecified: Secondary | ICD-10-CM

## 2015-05-20 DIAGNOSIS — E119 Type 2 diabetes mellitus without complications: Secondary | ICD-10-CM

## 2015-05-20 DIAGNOSIS — I872 Venous insufficiency (chronic) (peripheral): Secondary | ICD-10-CM

## 2015-05-20 DIAGNOSIS — M159 Polyosteoarthritis, unspecified: Secondary | ICD-10-CM

## 2015-05-20 DIAGNOSIS — F411 Generalized anxiety disorder: Secondary | ICD-10-CM

## 2015-05-20 NOTE — Telephone Encounter (Signed)
Called and spoke with pt Pt has an appt to see SN on 06/01/15 for physical Pt states that she would like to have her fasting labs before the appt  No Known Allergies   Dr Lenna Gilford, please advise as to what labs you would like to order on this pt. Thank!!

## 2015-05-21 NOTE — Telephone Encounter (Signed)
Per SN, Order FLP, BMET, CBC with diff, TSH, A1C, and Vit D level  Notify pt of labs  Orders placed Notified pt of pending labwork  Nothing further is needed at this time.

## 2015-05-26 ENCOUNTER — Other Ambulatory Visit (INDEPENDENT_AMBULATORY_CARE_PROVIDER_SITE_OTHER): Payer: Medicare Other

## 2015-05-26 DIAGNOSIS — M150 Primary generalized (osteo)arthritis: Secondary | ICD-10-CM

## 2015-05-26 DIAGNOSIS — E119 Type 2 diabetes mellitus without complications: Secondary | ICD-10-CM

## 2015-05-26 DIAGNOSIS — F411 Generalized anxiety disorder: Secondary | ICD-10-CM | POA: Diagnosis not present

## 2015-05-26 DIAGNOSIS — I10 Essential (primary) hypertension: Secondary | ICD-10-CM

## 2015-05-26 DIAGNOSIS — D126 Benign neoplasm of colon, unspecified: Secondary | ICD-10-CM | POA: Diagnosis not present

## 2015-05-26 DIAGNOSIS — E7800 Pure hypercholesterolemia, unspecified: Secondary | ICD-10-CM

## 2015-05-26 DIAGNOSIS — I872 Venous insufficiency (chronic) (peripheral): Secondary | ICD-10-CM

## 2015-05-26 DIAGNOSIS — M159 Polyosteoarthritis, unspecified: Secondary | ICD-10-CM

## 2015-05-26 LAB — CBC WITH DIFFERENTIAL/PLATELET
BASOS ABS: 0 10*3/uL (ref 0.0–0.1)
Basophils Relative: 0.6 % (ref 0.0–3.0)
Eosinophils Absolute: 0.1 10*3/uL (ref 0.0–0.7)
Eosinophils Relative: 1.9 % (ref 0.0–5.0)
HCT: 39.5 % (ref 36.0–46.0)
Hemoglobin: 13.5 g/dL (ref 12.0–15.0)
LYMPHS ABS: 1.7 10*3/uL (ref 0.7–4.0)
Lymphocytes Relative: 25.1 % (ref 12.0–46.0)
MCHC: 34.2 g/dL (ref 30.0–36.0)
MCV: 90.1 fl (ref 78.0–100.0)
MONOS PCT: 6.9 % (ref 3.0–12.0)
Monocytes Absolute: 0.5 10*3/uL (ref 0.1–1.0)
Neutro Abs: 4.5 10*3/uL (ref 1.4–7.7)
Neutrophils Relative %: 65.5 % (ref 43.0–77.0)
Platelets: 224 10*3/uL (ref 150.0–400.0)
RBC: 4.38 Mil/uL (ref 3.87–5.11)
RDW: 13.8 % (ref 11.5–15.5)
WBC: 6.9 10*3/uL (ref 4.0–10.5)

## 2015-05-26 LAB — BASIC METABOLIC PANEL
BUN: 14 mg/dL (ref 6–23)
CO2: 26 mEq/L (ref 19–32)
Calcium: 10.3 mg/dL (ref 8.4–10.5)
Chloride: 93 mEq/L — ABNORMAL LOW (ref 96–112)
Creatinine, Ser: 0.72 mg/dL (ref 0.40–1.20)
GFR: 85.77 mL/min (ref >60.00)
GLUCOSE: 161 mg/dL — AB (ref 70–99)
POTASSIUM: 4.5 meq/L (ref 3.5–5.1)
SODIUM: 131 meq/L — AB (ref 135–145)

## 2015-05-26 LAB — HEPATIC FUNCTION PANEL
ALBUMIN: 4.2 g/dL (ref 3.5–5.2)
ALK PHOS: 57 U/L (ref 39–117)
ALT: 35 U/L (ref 0–35)
AST: 33 U/L (ref 0–37)
BILIRUBIN TOTAL: 0.5 mg/dL (ref 0.2–1.2)
Bilirubin, Direct: 0 mg/dL (ref 0.0–0.3)
Total Protein: 7.3 g/dL (ref 6.0–8.3)

## 2015-05-26 LAB — HEMOGLOBIN A1C: HEMOGLOBIN A1C: 6.4 % (ref 4.6–6.5)

## 2015-05-26 LAB — LIPID PANEL
CHOL/HDL RATIO: 4
Cholesterol: 223 mg/dL — ABNORMAL HIGH (ref 0–200)
HDL: 57.5 mg/dL (ref >39.00)
NonHDL: 165.94
Triglycerides: 298 mg/dL — ABNORMAL HIGH (ref 0.0–149.0)
VLDL: 59.6 mg/dL — AB (ref 0.0–40.0)

## 2015-05-26 LAB — VITAMIN D 25 HYDROXY (VIT D DEFICIENCY, FRACTURES): VITD: 30.95 ng/mL (ref 30.00–100.00)

## 2015-05-26 LAB — LDL CHOLESTEROL, DIRECT: LDL DIRECT: 135 mg/dL

## 2015-05-26 LAB — TSH: TSH: 0.74 u[IU]/mL (ref 0.35–4.50)

## 2015-06-01 ENCOUNTER — Ambulatory Visit (INDEPENDENT_AMBULATORY_CARE_PROVIDER_SITE_OTHER): Payer: Medicare Other | Admitting: Pulmonary Disease

## 2015-06-01 ENCOUNTER — Encounter: Payer: Self-pay | Admitting: Pulmonary Disease

## 2015-06-01 VITALS — BP 140/78 | HR 105 | Temp 98.5°F | Wt 213.2 lb

## 2015-06-01 DIAGNOSIS — E669 Obesity, unspecified: Secondary | ICD-10-CM | POA: Diagnosis not present

## 2015-06-01 DIAGNOSIS — M159 Polyosteoarthritis, unspecified: Secondary | ICD-10-CM

## 2015-06-01 DIAGNOSIS — E119 Type 2 diabetes mellitus without complications: Secondary | ICD-10-CM

## 2015-06-01 DIAGNOSIS — M150 Primary generalized (osteo)arthritis: Secondary | ICD-10-CM

## 2015-06-01 DIAGNOSIS — D126 Benign neoplasm of colon, unspecified: Secondary | ICD-10-CM

## 2015-06-01 DIAGNOSIS — E7800 Pure hypercholesterolemia, unspecified: Secondary | ICD-10-CM | POA: Diagnosis not present

## 2015-06-01 DIAGNOSIS — M545 Low back pain, unspecified: Secondary | ICD-10-CM

## 2015-06-01 DIAGNOSIS — I872 Venous insufficiency (chronic) (peripheral): Secondary | ICD-10-CM | POA: Diagnosis not present

## 2015-06-01 DIAGNOSIS — I10 Essential (primary) hypertension: Secondary | ICD-10-CM

## 2015-06-01 MED ORDER — HYDROCHLOROTHIAZIDE 25 MG PO TABS: ORAL_TABLET | ORAL | Status: DC

## 2015-06-01 MED ORDER — SIMVASTATIN 40 MG PO TABS: 40.0000 mg | ORAL_TABLET | Freq: Every day | ORAL | Status: DC

## 2015-06-01 MED ORDER — LISINOPRIL 20 MG PO TABS: 20.0000 mg | ORAL_TABLET | Freq: Every day | ORAL | Status: DC

## 2015-06-01 MED ORDER — METFORMIN HCL 500 MG PO TABS: ORAL_TABLET | ORAL | Status: DC

## 2015-06-01 MED ORDER — POTASSIUM CHLORIDE CRYS ER 20 MEQ PO TBCR: 20.0000 meq | EXTENDED_RELEASE_TABLET | Freq: Every day | ORAL | Status: DC

## 2015-06-01 NOTE — Progress Notes (Addendum)
Subjective:    Patient ID: Cynthia [name], female    DOB: Apr 16, 1946, 69 y.o.   MRN: [id number]  HPI 69 y/o WF here for a follow up visit... she has multiple medical problems as noted below...  ~  SEE PREV EPIC NOTES FOR OLDER DATA >>    LABS 10/13:  FLP not at goal on Simva20 w/ TG=286 LDL=114;  Chems- ok x BS=138 A1c=6.2 LFTs=sl elev;  CBC- wnl;  TSH=0.81   ~  May 30, 2013:  Yearly [place] says she's had a good yr- no new complaints or concerns; she is now retired;     HBP> on ASA81, Lisinopril20, HCT25, K20; BP= 134/70 & she feels well w/o CP, palpit, dizzy, SOB, edema, etc; still needs to incr exercise!    Ven Insuffic> on low sodium diet + the diuretic; reminded of elevation & support hose; she denies swelling...    Chol> on Simva40 now; FLP 10/14 shows TChol 177, TG 185, HDL 66, LDL 74; this is much improved, but needs better low fat diet...    DM> on Metform500Bid & Glimep1mg -1/2 tabQam; BS=146, A1c=6.1; Stable- continue same & the wt loss program!    Obesity> wt=217# which is down 8# further in the last yr; we reviewed the necessary diet & exercise program for additional wt reduction...    GI- colon polyps> she had 3 adenomatous polyps removed 11/09 by DrDBrodie; f/u colon 12/13 showed one sm sessile polyp in sigmoid, Bx=hyperplastic, she said f/u 60yrs.    LBP> she denies back pain but has some left hip discomfort w/ walking; rec OTC analgesics as needed...    Anxiety> on Alprazolam0.5mg  prn... We reviewed prob list, meds, xrays and labs> see below for updates >> OK 2014 Flu shot today & 90 day refill Rxs...  LABS 10/14:  FLP- ok on Simva40 x TG=185;  Chems- ok w/ BS=146 Ca=10.4 SGOT=40 A1c=6.1;  CBC= wnl;  TSH=0.61...  ~  May 30, 2014:  Yearly [facility] & [place] tells me that she has a trigger finger on her left hand & may need surg per DrGramig; We reviewed the following medical problems during today's office visit >>     HBP> on ASA81, Lisinopril20, HCT25, K20; BP= 128/80 & she  feels well w/o CP, palpit, dizzy, SOB, edema, etc; still needs diet & exercise!    Ven Insuffic> on low sodium diet + the diuretic; reminded of elevation & support hose; she denies swelling...    Chol> on Simva40; FLP 10/15 shows TChol 155, TG 200, HDL 61, LDL 54; she needs better low fat diet & wt reduction...    DM> on Metform500Bid & off Glimep; Labs 10/15 showed BS=121, A1c=6.1; Stable- continue same & the wt loss program!    Obesity> wt=221# which is up 4# this past yr; we reviewed the necessary diet & exercise program to be successful w/ wt reduction...    GI- colon polyps> she had 3 adenomatous polyps removed 11/09 by DrDBrodie; f/u colon 12/13 showed one sm sessile polyp in sigmoid, Bx=hyperplastic, she said f/u 30yrs.    LBP> she denies back pain but has some left hip discomfort w/ walking; rec OTC analgesics as needed...    Anxiety> on Alprazolam0.5mg  prn... We reviewed prob list, meds, xrays and labs> see below for updates >> she had the 2015 Flu vaccine 9/15; given refills per request...   CXR 10/15 showed norm heart size, clear lungs, NAD.Marland KitchenMarland Kitchen  EKG 10/15 showed NSR, rate 78, low voltage & poor  r progression...   LABS 10/15:  FLP- Chol is ok but TG=200 & needs wt reduction;  Chems- ok x BS=121 A1c=6.1;  CBC- wnl;  TSH=0.69...  ~  June 01, 2015:  Yearly [facility] & medical follow up visit> Cynthia [name] reports a good yr, no new complaints or concerns; then she mentions occas dizzy- off & on, eg if I'm walking straight & look to the side; no cerebral ischemic symptoms, I offered Neuro referral for further eval & scan but she declines at this time and will let me know if it gets worse...     HBP> on ASA81, Lisinopril20, HCT25-1/2, K20; BP= 140/78 & she feels well w/o CP, palpit, dizzy, SOB, edema, etc; still needs diet & exercise!    Ven Insuffic> on low sodium diet + the diuretic; reminded of elevation & support hose; she denies swelling...    Chol> on Simva40; FLP 10/16 shows TChol 223, TG 298, HDL  58, LDL 135; states she taking med regularly- needs better low fat diet & wt reduction...    DM> on Metform500Bid & off Glimep; Labs 10/16 showed BS=161, A1c=6.4; Stable- continue same med & the wt loss program!    Obesity> wt=213# which is down 8# this past yr; we reviewed the necessary diet & exercise program to be successful w/ wt reduction...    GI- colon polyps> she had 3 adenomatous polyps removed 11/09 by DrDBrodie; f/u colon 12/13 showed one sm sessile polyp in sigmoid, Bx=hyperplastic, she said f/u 1yrs.    LBP> she denies back pain but has some left hip discomfort w/ walking; rec OTC analgesics as needed (Aleve)...    Anxiety> on Alprazolam0.5mg  prn... We reviewed prob list, meds, xrays and labs> she takes numerous supplements- calcium, MVI, VitD, Vit B12, Fe...  LABS 10/16:  FLP- way off, states she's taking med daily, needs better low chol/ low fat diet;  Chems- ok x BS=161, A1c=6.4;  CBC- wnl;  TSH=0.74... IMP/PLAN>>  Generally stable but BMI still ~40 & needs better diet/ exercise/ wt reduction strategies;  In the interim, rec to take meds regularly every day, concentrate on food w/ low glycemic index...           Problem List:  ASTHMATIC BRONCHITIS, ACUTE (ICD-466.0) - no recent exas... ~  CXR 10/15 showed norm heart size, clear lungs, NAD...  HYPERTENSION (ICD-401.9) - on ASA 81mg /d,  LISINOPRIL 20mg /d,  HCTZ 25mg /d,  & K20/d... ~  baseline EKG w/ poor R progression & NSSTTWA... ~  Cath in 9/04 was normal- norm coronaries, norm LVF, etc... rec for aggressive primary prevention. ~  10/12:  CXR showed normal heart size, clear lungs but sl low lung vol's;  EKG showed NSR, ?poor R progression, NAD... ~  10/13: on ASA81, Lisinopril20, HCT25, K20; BP= 130/70 & she feels well w/o CP, palpit, dizzy, SOB, edema, etc... ~  10/14: on ASA81, Lisinopril20, HCT25, K20; BP= 134/70 & she remains asymptomatic; still needs to incr exercise! ~  10/15: on ASA81, Lisinopril20, HCT25, K20; BP=  128/80 & she feels well w/o CP, palpit, dizzy, SOB, edema, etc; still needs diet & exercise! ~  EKG 10/15 showed NSR, rate 78, low voltage & poor r progression...   VENOUS INSUFFICIENCY (ICD-459.81) - she has mild VI & 1+edema... on low sodium diet, elevation, support hose...  HYPERCHOLESTEROLEMIA (ICD-272.0) >>  ~  FLP 6/08 on Simva20 showed TChol 193, TG 205, HDL 56, LDL 108...  ~  [place] 7/09 on Simva20 showed TChol 176, TG 161, HDL  50, LDL 94... keep same med, better diet effort. ~  FLP 10/10 on Simva20 showed TChol 178, TG 190, HDL 51, LDL 89 ~  FLP 10/11 on Simva20 showed TChol 197, TG 213, HDL 58, LDL 120 ~  FLP 10/12 on Simva20 showed TChol 186, TG 292, HDL 59, LDL 101... Needs better low fat diet & wt reduction. ~  FLP 10/13 on Simva20 showed TChol 204, TG 286, HDL 58, LDL 114... Not at goals, rec incr SIMVA40... ~  FLP 10/14 on Simva40 showed TChol 177, TG 185, HDL 66, LDL 74... Same med, better low fat diet. ~  FLP 10/15 on Simva40 showed TChol 155, TG 200, HDL 61, LDL 54... Chol ok but elev TG needs better low fat diet & wt reduction...  DIABETES MELLITUS (ICD-250.00) - on diet + METFORMIN 500mg Bid,  GLIMEPIRIDE 1mg -1/2 tab Qam...  ~  labs 6/09 showed BS= 144, HgA1c= 7.4... she was on Metformin once daily- incr to Bid... ~  labs 7/09 showed BS= 138, HgA1c= 6.4.Marland KitchenMarland Kitchen rec better diet & get weight down... ~  labs 10/10 on Metform500Bid+Actos15 showed BS= 131, A1c= 6.6.Marland Kitchen. rec> same meds, better diet, get wt down! ~  labs 10/11 on Metform500Bid+Actos15 showed BS= 123, A1c= 6.6 ~  Labs 10/12 on Metform500Bid+Actos15 showed BS=157, A1c=6.8; Actos is too $$ & we will switch to GLIMEP1mg - 1/2 Qam. ~  Labs 10/13 on Metform500Bid+Glim0.5 showed BS=138, A1c=6.2 ~  Labs 10/14 on Metform500Bid+Glim1mg -1/2tab showed BS=146, A1c=6.1 ~  Labs 10/15 on Metform500Bid showed BS=121, A1c=6.1  MORBID OBESITY (ICD-278.01) - we reviewed weight reducing diet & exercise program... ~  8/09: weight = 237# which  is down 6# since 2008... discussed diet + exercise... ~  10/10: weight = 248#... reviewed diet + exercise perscription for wt reduction... ~  10/11:  weight = 248# ~  10/12:  Weight = 244# ~  10/13:  Weight = 225# ~  10/14:  Weight = 217# ~  10/15:  Weight = 221#  COLONIC POLYPS (ICD-211.3) - pt finally had routine screening colonoscopy 11/09 w/ 3 adenomatous polyps removed by DrDBrodie... f/u planned 18yrs... ~  10/12:  DrDBrodie suggested f/u colon in 3 yrs, we will send flag to her to review chart & set this up... ~  Colonoscopy done 12/13 w/ one sm sessile polyp in sigmoid, Bx=hyperplastic, she said f/u 21yrs  ORTHO >> she had right wrist CTS surg by DrGramig 2013 ~  10/15: she reports trigger finger & may need surg by DrGramig...  BACK PAIN, LUMBAR (ICD-724.2) - s/p lumbar lam in 1977... ~  BMD 10/12 here showed TScores +2.6 in Spine, and -1.4 in left FemNeck; Rec to take calcium, MVI, VitD & wt bearing exercise...  HEADACHE (ICD-784.0) - hx dizziness secondary to hyperventilation in past w/ eval from Neurology...  ANXIETY DISORDER (ICD-300.00) - on ALPRAZOLAM 0.5mg  Prn...  HEALTH MAINTENANCE:  ~  GI:  ?due for f/u colon w/ DrBrodie> we will send inquiry... ~  GYN:  She doesn't currently have a GYN, ?who she saw prev?, encouraged to pick & call for PAP, Mammogram (done [facility] 10/13- neg), BMD (done 10/12 here w/ Tscore -1.4 in left FemNeck; on Ca, MVI, VitD, wt bearing exer)... ~  Immuniz:  She receives the seasonal Flu vaccine each fall...   Past Surgical History  Procedure Laterality Date  . Pelvic abcess drainage  1970    Dr. Marjory Sneddon  . Lumbar laminectomy  1997    Dr. Quentin Cornwall  . Colonoscopy    .  Polypectomy    . Carpal tunnel release    . Dilation and curettage of uterus      Outpatient Encounter Prescriptions as of 06/01/2015  Medication Sig  . aspirin 81 MG tablet Take 81 mg by mouth daily.    . Calcium Carbonate Antacid (TUMS E-X PO) Take by mouth as needed.   . cholecalciferol (VITAMIN D) 1000 UNITS tablet Take 1,000 Units by mouth daily.  . famotidine (PEPCID) 10 MG tablet Take 10 mg by mouth as needed.  . ferrous sulfate 325 (65 FE) MG tablet Take 325 mg by mouth daily with breakfast.    . hydrochlorothiazide (HYDRODIURIL) 25 MG tablet TAKE 1/2  TABLET BY MOUTH DAILY.  Marland Kitchen lisinopril (PRINIVIL,ZESTRIL) 20 MG tablet Take 1 tablet (20 mg total) by mouth daily.  . metFORMIN (GLUCOPHAGE) 500 MG tablet TAKE 1 TABLET BY MOUTH TWICE DAILY WITH A MEAL  . Multiple Vitamins-Minerals (MULTIPLE VITAMINS/WOMENS PO) Take 1 tablet by mouth daily.    . naproxen sodium (ANAPROX) 220 MG tablet Take 220 mg by mouth 2 (two) times daily with a meal.  . potassium chloride SA (K-DUR,KLOR-CON) 20 MEQ tablet Take 1 tablet (20 mEq total) by mouth daily.  . simvastatin (ZOCOR) 40 MG tablet Take 1 tablet (40 mg total) by mouth at bedtime.  . vitamin B-12 (CYANOCOBALAMIN) 1000 MCG tablet Take 1,000 mcg by mouth daily.  Marland Kitchen glimepiride (AMARYL) 1 MG tablet Take 1/2 tablet daily      (Pt STOPPED this med in 2015)   No facility-administered encounter medications on file as of 06/01/2015.    No Known Allergies   Current Medications, Allergies, Past Medical History, Past Surgical History, Family History, and Social History were reviewed in [employer] record.     Review of Systems        The patient complains of fatigue, dyspnea on exertion, stiffness, and arthritis.  The patient denies fever, chills, sweats, anorexia, weakness, malaise, weight loss, sleep disorder, blurring, diplopia, eye irritation, eye discharge, vision loss, eye pain, photophobia, earache, ear discharge, tinnitus, decreased hearing, nasal congestion, nosebleeds, sore throat, hoarseness, chest pain, palpitations, syncope, orthopnea, PND, peripheral edema, cough, dyspnea at rest, excessive sputum, hemoptysis, wheezing, pleurisy, nausea, vomiting, diarrhea, constipation, change in bowel  habits, abdominal pain, melena, hematochezia, jaundice, gas/bloating, indigestion/heartburn, dysphagia, odynophagia, dysuria, hematuria, urinary frequency, urinary hesitancy, nocturia, incontinence, back pain, joint pain, joint swelling, muscle cramps, muscle weakness, sciatica, restless legs, leg pain at night, leg pain with exertion, rash, itching, dryness, suspicious lesions, paralysis, paresthesias, seizures, tremors, vertigo, transient blindness, frequent falls, frequent headaches, difficulty walking, depression, anxiety, memory loss, confusion, cold intolerance, heat intolerance, polydipsia, polyphagia, polyuria, unusual weight change, abnormal bruising, bleeding, enlarged lymph nodes, urticaria, allergic rash, hay fever, and recurrent infections.     Objective:   Physical Exam     WD, Obese, 69 y/o WF in NAD... GENERAL:  Alert & oriented; pleasant & cooperative... HEENT:  [place]/AT, EOM-wnl, PERRLA, EACs-clear, TMs-wnl, NOSE-clear, THROAT-clear & wnl. NECK:  Supple w/ fairROM; no JVD; normal carotid impulses w/o bruits; no thyromegaly or nodules palpated; no lymphadenopathy. CHEST:  Clear to P & A; without wheezes/ rales/ or rhonchi. HEART:  Regular Rhythm; without murmurs/ rubs/ or gallops. ABDOMEN:  Obese, soft & nontender w/ large panniculus; normal bowel sounds; no organomegaly or masses detected. EXT: without deformities, mild arthritic changes; no varicose veins/ +venous insuffic/ tr edema... NEURO:  CN's intact;  no focal neuro deficits... DERM:  no rash, no lesions noted...  RADIOLOGY  DATA:  Reviewed in the EPIC EMR & discussed w/ the patient...  LABORATORY DATA:  Reviewed in the EPIC EMR & discussed w/ the patient...   Assessment & Plan:    HBP>  Controlled on her meds but needs better diet & wt reduction...  Ven Insuffic>  On sodium restriction, elevation, support hose;  She has HCT as well helping the edema...  CHOL>  On Simva40 now & FLP is off; TG still elev & we reviewed  low fat diet...  DM>  Her A1c remains in the 6's;  She will watch BS at home in light of her A1c=6.4 & current meds...  Obesity>  BMI in the 40's; she must diet, exercise, get the wt down...  Hx colon polyps>  F/u colon done 12/13 w/ one sm hyperplastic polyp removed; DrBrodie said f/u 10 yrs...  Hx LBP>  She had LLam in 1977; uses OTC analgesics as needed...  Anxiety>  She has Alpraz for prn use...   Patient's Medications  New Prescriptions   No medications on file  Previous Medications   ASPIRIN 81 MG TABLET    Take 81 mg by mouth daily.     CALCIUM CARBONATE ANTACID (TUMS E-X PO)    Take by mouth as needed.   CHOLECALCIFEROL (VITAMIN D) 1000 UNITS TABLET    Take 1,000 Units by mouth daily.   FAMOTIDINE (PEPCID) 10 MG TABLET    Take 10 mg by mouth as needed.   FERROUS SULFATE 325 (65 FE) MG TABLET    Take 325 mg by mouth daily with breakfast.     GLIMEPIRIDE (AMARYL) 1 MG TABLET    Take 1/2 tablet daily   MULTIPLE VITAMINS-MINERALS (MULTIPLE VITAMINS/WOMENS PO)    Take 1 tablet by mouth daily.     NAPROXEN SODIUM (ANAPROX) 220 MG TABLET    Take 220 mg by mouth 2 (two) times daily with a meal.   VITAMIN B-12 (CYANOCOBALAMIN) 1000 MCG TABLET    Take 1,000 mcg by mouth daily.  Modified Medications   Modified Medication Previous Medication   HYDROCHLOROTHIAZIDE (HYDRODIURIL) 25 MG TABLET hydrochlorothiazide (HYDRODIURIL) 25 MG tablet      TAKE 1/2  TABLET BY MOUTH DAILY.    TAKE 1/2  TABLET BY MOUTH DAILY.   LISINOPRIL (PRINIVIL,ZESTRIL) 20 MG TABLET lisinopril (PRINIVIL,ZESTRIL) 20 MG tablet      Take 1 tablet (20 mg total) by mouth daily.    TAKE 1 TABLET BY MOUTH DAILY   METFORMIN (GLUCOPHAGE) 500 MG TABLET metFORMIN (GLUCOPHAGE) 500 MG tablet      TAKE 1 TABLET BY MOUTH TWICE DAILY WITH A MEAL    TAKE 1 TABLET BY MOUTH TWICE DAILY WITH A MEAL   POTASSIUM CHLORIDE SA (K-DUR,KLOR-CON) 20 MEQ TABLET potassium chloride SA (K-DUR,KLOR-CON) 20 MEQ tablet      Take 1 tablet (20 mEq  total) by mouth daily.    TAKE 1 TABLET BY MOUTH EVERY DAY   SIMVASTATIN (ZOCOR) 40 MG TABLET simvastatin (ZOCOR) 40 MG tablet      Take 1 tablet (40 mg total) by mouth at bedtime.    TAKE 1 TABLET BY MOUTH AT BEDTIME  Discontinued Medications   No medications on file

## 2015-06-01 NOTE — Patient Instructions (Signed)
Today we updated your med list in our EPIC system...    Continue your current medications the same...  We refilled your meds per request...  Today we reviewed your recent FASTING blood work-     DM control is adeq w/ A1c=6.4, but FBS=161 & your lipid panel is off...  Let's get on track w/ our diet & exercise program...    Look up the "glycemic index" and stay away from foods w/ a HIGH glycemic index...  Remember>> NO SALT (sodium)...  Call for any questions...  Let's plan a follow up visit in 49yr, sooner if needed for problems.Marland KitchenMarland Kitchen

## 2015-06-02 ENCOUNTER — Emergency Department (HOSPITAL_COMMUNITY): Payer: Medicare Other

## 2015-06-02 ENCOUNTER — Emergency Department (HOSPITAL_COMMUNITY)
Admission: EM | Admit: 2015-06-02 | Discharge: 2015-06-02 | Disposition: A | Discharge status: Home / Self Care | Payer: Medicare Other | Attending: Emergency Medicine | Admitting: Emergency Medicine

## 2015-06-02 ENCOUNTER — Encounter (HOSPITAL_COMMUNITY): Payer: Self-pay | Admitting: Emergency Medicine

## 2015-06-02 DIAGNOSIS — Y9289 Other specified places as the place of occurrence of the external cause: Secondary | ICD-10-CM | POA: Diagnosis not present

## 2015-06-02 DIAGNOSIS — Z87891 Personal history of nicotine dependence: Secondary | ICD-10-CM | POA: Insufficient documentation

## 2015-06-02 DIAGNOSIS — W1839XA Other fall on same level, initial encounter: Secondary | ICD-10-CM | POA: Diagnosis not present

## 2015-06-02 DIAGNOSIS — Y998 Other external cause status: Secondary | ICD-10-CM | POA: Insufficient documentation

## 2015-06-02 DIAGNOSIS — R55 Syncope and collapse: Secondary | ICD-10-CM | POA: Insufficient documentation

## 2015-06-02 DIAGNOSIS — S199XXA Unspecified injury of neck, initial encounter: Secondary | ICD-10-CM | POA: Diagnosis not present

## 2015-06-02 DIAGNOSIS — Z86010 Personal history of colonic polyps: Secondary | ICD-10-CM | POA: Diagnosis not present

## 2015-06-02 DIAGNOSIS — E119 Type 2 diabetes mellitus without complications: Secondary | ICD-10-CM | POA: Insufficient documentation

## 2015-06-02 DIAGNOSIS — H3562 Retinal hemorrhage, left eye: Secondary | ICD-10-CM | POA: Diagnosis not present

## 2015-06-02 DIAGNOSIS — S025XXA Fracture of tooth (traumatic), initial encounter for closed fracture: Secondary | ICD-10-CM | POA: Diagnosis not present

## 2015-06-02 DIAGNOSIS — E782 Mixed hyperlipidemia: Secondary | ICD-10-CM | POA: Insufficient documentation

## 2015-06-02 DIAGNOSIS — R220 Localized swelling, mass and lump, head: Secondary | ICD-10-CM | POA: Diagnosis not present

## 2015-06-02 DIAGNOSIS — Y9389 Activity, other specified: Secondary | ICD-10-CM | POA: Diagnosis not present

## 2015-06-02 DIAGNOSIS — I10 Essential (primary) hypertension: Secondary | ICD-10-CM | POA: Insufficient documentation

## 2015-06-02 DIAGNOSIS — S058X2A Other injuries of left eye and orbit, initial encounter: Secondary | ICD-10-CM | POA: Insufficient documentation

## 2015-06-02 DIAGNOSIS — Z79899 Other long term (current) drug therapy: Secondary | ICD-10-CM | POA: Insufficient documentation

## 2015-06-02 DIAGNOSIS — F419 Anxiety disorder, unspecified: Secondary | ICD-10-CM | POA: Diagnosis not present

## 2015-06-02 DIAGNOSIS — S0532XA Ocular laceration without prolapse or loss of intraocular tissue, left eye, initial encounter: Secondary | ICD-10-CM | POA: Diagnosis not present

## 2015-06-02 DIAGNOSIS — Z961 Presence of intraocular lens: Secondary | ICD-10-CM | POA: Diagnosis not present

## 2015-06-02 DIAGNOSIS — H1132 Conjunctival hemorrhage, left eye: Secondary | ICD-10-CM | POA: Diagnosis not present

## 2015-06-02 DIAGNOSIS — S0592XA Unspecified injury of left eye and orbit, initial encounter: Secondary | ICD-10-CM

## 2015-06-02 DIAGNOSIS — W19XXXA Unspecified fall, initial encounter: Secondary | ICD-10-CM | POA: Diagnosis not present

## 2015-06-02 DIAGNOSIS — S0522XA Ocular laceration and rupture with prolapse or loss of intraocular tissue, left eye, initial encounter: Secondary | ICD-10-CM | POA: Diagnosis not present

## 2015-06-02 DIAGNOSIS — Z7982 Long term (current) use of aspirin: Secondary | ICD-10-CM | POA: Insufficient documentation

## 2015-06-02 DIAGNOSIS — Z791 Long term (current) use of non-steroidal anti-inflammatories (NSAID): Secondary | ICD-10-CM | POA: Insufficient documentation

## 2015-06-02 DIAGNOSIS — Y929 Unspecified place or not applicable: Secondary | ICD-10-CM | POA: Diagnosis not present

## 2015-06-02 LAB — BASIC METABOLIC PANEL
Anion gap: 13 (ref 5–15)
BUN: 13 mg/dL (ref 6–20)
CHLORIDE: 100 mmol/L — AB (ref 101–111)
CO2: 24 mmol/L (ref 22–32)
Calcium: 9.9 mg/dL (ref 8.9–10.3)
Creatinine, Ser: 0.85 mg/dL (ref 0.44–1.00)
GFR calc non Af Amer: >60 mL/min (ref >60)
Glucose, Bld: 154 mg/dL — ABNORMAL HIGH (ref 65–99)
POTASSIUM: 4.2 mmol/L (ref 3.5–5.1)
SODIUM: 137 mmol/L (ref 135–145)

## 2015-06-02 LAB — CBC WITH DIFFERENTIAL/PLATELET
BASOS PCT: 0 %
Basophils Absolute: 0 10*3/uL (ref 0.0–0.1)
EOS ABS: 0.1 10*3/uL (ref 0.0–0.7)
Eosinophils Relative: 1 %
HEMATOCRIT: 38.2 % (ref 36.0–46.0)
HEMOGLOBIN: 12.7 g/dL (ref 12.0–15.0)
LYMPHS ABS: 1.4 10*3/uL (ref 0.7–4.0)
Lymphocytes Relative: 18 %
MCH: 30.6 pg (ref 26.0–34.0)
MCHC: 33.2 g/dL (ref 30.0–36.0)
MCV: 92 fL (ref 78.0–100.0)
Monocytes Absolute: 0.8 10*3/uL (ref 0.1–1.0)
Monocytes Relative: 10 %
NEUTROS ABS: 5.6 10*3/uL (ref 1.7–7.7)
NEUTROS PCT: 71 %
Platelets: 190 10*3/uL (ref 150–400)
RBC: 4.15 MIL/uL (ref 3.87–5.11)
RDW: 13.8 % (ref 11.5–15.5)
WBC: 8 10*3/uL (ref 4.0–10.5)

## 2015-06-02 LAB — I-STAT TROPONIN, ED: TROPONIN I, POC: 0 ng/mL (ref 0.00–0.08)

## 2015-06-02 MED ORDER — FLUORESCEIN SODIUM 1 MG OP STRP
1.0000 | ORAL_STRIP | Freq: Once | OPHTHALMIC | Status: AC
  Administered 2015-06-02: 1 via OPHTHALMIC
  Filled 2015-06-02: qty 1

## 2015-06-02 MED ORDER — TETRACAINE HCL 0.5 % OP SOLN
1.0000 [drp] | Freq: Once | OPHTHALMIC | Status: AC
  Administered 2015-06-02: 1 [drp] via OPHTHALMIC
  Filled 2015-06-02: qty 2

## 2015-06-02 NOTE — ED Provider Notes (Signed)
CSN: [id number]     Arrival date & time 06/02/15  0086 History   First MD Initiated Contact with Patient 06/02/15 [phone]     Chief Complaint  Patient presents with  . Fall     (Consider location/radiation/quality/duration/timing/severity/associated sxs/prior Treatment) HPI Comments: Patient presents emergency department with chief complaint of fall and head injury. Patient states that she became dizzy last night and fell to the ground striking her head. She states that she woke up next to the bed post. She states that she has been having frequent episodes of dizziness, and has been told to follow-up with the neurologist, but states that she has been "putting it off." She states that she does not remember falling. She denies any chest pain or shortness breath. She states that she has pain to the left side of her face. Additionally she complains of blurry vision out of her left eye. She states that her pain is moderate. Her symptoms are worsened with movement and palpation. There are no other associated symptoms.  The history is provided by the patient. No language interpreter was used.    Past Medical History  Diagnosis Date  . Asthmatic bronchitis   . Hypertension   . Venous insufficiency   . Hypercholesteremia   . Diabetes mellitus   . Morbid obesity ([place])   . Colonic polyp   . Degenerative joint disease   . Lumbar back pain   . Headache(784.0)   . Anxiety disorder    Past Surgical History  Procedure Laterality Date  . Pelvic abcess drainage  1970    Dr. Marjory Sneddon  . Lumbar laminectomy  1997    Dr. Quentin Cornwall  . Colonoscopy    . Polypectomy    . Carpal tunnel release    . Dilation and curettage of uterus     Family History  Problem Relation Age of Onset  . Heart disease Mother   . Diabetes Mother   . Heart disease Father   . Diabetes      sibling  . Diabetes Brother   . Diabetes Maternal Grandmother   . Diabetes Paternal Grandmother   . Diabetes Brother    Social  History  Substance Use Topics  . Smoking status: Former Smoker -- 0.40 packs/day for 6 years    Types: Cigarettes    Quit date: 08/01/1978  . Smokeless tobacco: Never Used  . Alcohol Use: No   OB History    No data available     Review of Systems  Constitutional: Negative for fever and chills.  Respiratory: Negative for shortness of breath.   Cardiovascular: Negative for chest pain.  Gastrointestinal: Negative for nausea, vomiting, diarrhea and constipation.  Genitourinary: Negative for dysuria.  Neurological: Positive for syncope.  All other systems reviewed and are negative.     Allergies  Review of patient's allergies indicates no known allergies.  Home Medications   Prior to Admission medications   Medication Sig Start Date End Date Taking? Authorizing Provider  aspirin 81 MG tablet Take 81 mg by mouth daily.      Historical Provider, MD  Calcium Carbonate Antacid (TUMS E-X PO) Take by mouth as needed.    Historical Provider, MD  cholecalciferol (VITAMIN D) 1000 UNITS tablet Take 1,000 Units by mouth daily.    Historical Provider, MD  famotidine (PEPCID) 10 MG tablet Take 10 mg by mouth as needed.    Historical Provider, MD  ferrous sulfate 325 (65 FE) MG tablet Take 325 mg by mouth  daily with breakfast.      Historical Provider, MD  glimepiride (AMARYL) 1 MG tablet Take 1/2 tablet daily 05/31/12   Noralee Space, MD  hydrochlorothiazide (HYDRODIURIL) 25 MG tablet TAKE 1/2  TABLET BY MOUTH DAILY. 06/01/15   Noralee Space, MD  lisinopril (PRINIVIL,ZESTRIL) 20 MG tablet Take 1 tablet (20 mg total) by mouth daily. 06/01/15   Noralee Space, MD  metFORMIN (GLUCOPHAGE) 500 MG tablet TAKE 1 TABLET BY MOUTH TWICE DAILY WITH A MEAL 06/01/15   Noralee Space, MD  Multiple Vitamins-Minerals (MULTIPLE VITAMINS/WOMENS PO) Take 1 tablet by mouth daily.      Historical Provider, MD  naproxen sodium (ANAPROX) 220 MG tablet Take 220 mg by mouth 2 (two) times daily with a meal.    Historical  Provider, MD  potassium chloride SA (K-DUR,KLOR-CON) 20 MEQ tablet Take 1 tablet (20 mEq total) by mouth daily. 06/01/15   Noralee Space, MD  simvastatin (ZOCOR) 40 MG tablet Take 1 tablet (40 mg total) by mouth at bedtime. 06/01/15   Noralee Space, MD  vitamin B-12 (CYANOCOBALAMIN) 1000 MCG tablet Take 1,000 mcg by mouth daily.    Historical Provider, MD   BP 159/75 mmHg  Pulse 88  Temp(Src) 97.3 F (36.3 C) (Oral)  Resp 16  SpO2 98% Physical Exam  Constitutional: She is oriented to person, place, and time. She appears well-developed and well-nourished.  HENT:  Head: Normocephalic and atraumatic.  Left sided subconjunctival hemorrhage, left pupil irregularly shaped, defect laterally, no hyphema  TTP of periorbital soft tissue and bony structures without deformity  Eyes: Conjunctivae and EOM are normal. Pupils are equal, round, and reactive to light.  No fluorescein uptake    Visual Acuity  Right Eye Distance: 20/50 Left Eye Distance: 20/50 Bilateral Distance: 20/50    Neck: Normal range of motion. Neck supple.  Cardiovascular: Normal rate and regular rhythm.  Exam reveals no gallop and no friction rub.   No murmur heard. Pulmonary/Chest: Effort normal and breath sounds normal. No respiratory distress. She has no wheezes. She has no rales. She exhibits no tenderness.  Abdominal: Soft. Bowel sounds are normal. She exhibits no distension and no mass. There is no tenderness. There is no rebound and no guarding.  Musculoskeletal: Normal range of motion. She exhibits no edema or tenderness.  Neurological: She is alert and oriented to person, place, and time.  CN 3-12 intact, speech is clear, movements are goal oriented  Skin: Skin is warm and dry.  Psychiatric: She has a normal mood and affect. Her behavior is normal. Judgment and thought content normal.  Nursing note and vitals reviewed.   ED Course  Procedures (including critical care time) Results for orders placed or  performed during the hospital encounter of 06/02/15  CBC with Differential/Platelet  Result Value Ref Range   WBC 8.0 4.0 - 10.5 K/uL   RBC 4.15 3.87 - 5.11 MIL/uL   Hemoglobin 12.7 12.0 - 15.0 g/dL   HCT 38.2 36.0 - 46.0 %   MCV 92.0 78.0 - 100.0 fL   MCH 30.6 26.0 - 34.0 pg   MCHC 33.2 30.0 - 36.0 g/dL   RDW 13.8 11.5 - 15.5 %   Platelets 190 150 - 400 K/uL   Neutrophils Relative % 71 %   Neutro Abs 5.6 1.7 - 7.7 K/uL   Lymphocytes Relative 18 %   Lymphs Abs 1.4 0.7 - 4.0 K/uL   Monocytes Relative 10 %   Monocytes Absolute 0.8  0.1 - 1.0 K/uL   Eosinophils Relative 1 %   Eosinophils Absolute 0.1 0.0 - 0.7 K/uL   Basophils Relative 0 %   Basophils Absolute 0.0 0.0 - 0.1 K/uL  Basic metabolic panel  Result Value Ref Range   Sodium 137 135 - 145 mmol/L   Potassium 4.2 3.5 - 5.1 mmol/L   Chloride 100 (L) 101 - 111 mmol/L   CO2 24 22 - 32 mmol/L   Glucose, Bld 154 (H) 65 - 99 mg/dL   BUN 13 6 - 20 mg/dL   Creatinine, Ser 0.85 0.44 - 1.00 mg/dL   Calcium 9.9 8.9 - 10.3 mg/dL   GFR calc non Af Amer >60 >60 mL/min   GFR calc Af Amer >60 >60 mL/min   Anion gap 13 5 - 15  I-stat troponin, ED  Result Value Ref Range   Troponin i, poc 0.00 0.00 - 0.08 ng/mL   Comment 3           Ct Head Wo Contrast  06/02/2015  CLINICAL DATA:  69 year old female with history of presumed fall to the floor (patient woke up on the floor on her face this morning). Redness and swelling in the left cheek and orbit. Blurred vision in the left side. EXAM: CT HEAD WITHOUT CONTRAST CT MAXILLOFACIAL WITHOUT CONTRAST CT CERVICAL SPINE WITHOUT CONTRAST TECHNIQUE: Multidetector CT imaging of the head, cervical spine, and maxillofacial structures were performed using the standard protocol without intravenous contrast. Multiplanar CT image reconstructions of the cervical spine and maxillofacial structures were also generated. COMPARISON:  No priors. FINDINGS: CT HEAD FINDINGS Patchy and confluent areas of decreased  attenuation are noted throughout the deep and periventricular white matter of the cerebral hemispheres bilaterally, compatible with chronic microvascular ischemic disease. No acute displaced skull fractures are identified. No acute intracranial abnormality. Specifically, no evidence of acute post-traumatic intracranial hemorrhage, no definite regions of acute/subacute cerebral ischemia, no focal mass, mass effect, hydrocephalus or abnormal intra or extra-axial fluid collections. The visualized paranasal sinuses and mastoids are well pneumatized. CT MAXILLOFACIAL FINDINGS Tooth number 13 appears fractured, and there is extensive periapical lucency adjacent to the roots of this tooth (whether not this is an acute injury or chronic is uncertain). No acute displaced facial bone fractures are identified. Specifically, pterygoid plates are intact. Mandible is intact, and mandibular condyles are located bilaterally. Minimal soft tissue swelling around the left orbit. Bilateral globes and retro-orbital soft tissues are grossly unremarkable in appearance. CT CERVICAL SPINE FINDINGS No acute displaced cervical spine fractures. Reversal of cervical lordosis centered at the level of C4. Alignment is otherwise anatomic. Prevertebral soft tissues are normal. Multilevel degenerative disc disease, most severe at C4-C5, C5-C6 and C6-C7. Multilevel facet arthropathy with complete fusion of the left C2-C3 facets. The visualized portions of the upper thorax are unremarkable. IMPRESSION: 1. No evidence of significant acute traumatic injury to the skull, brain, or cervical spine. 2. Tooth number 13 is fractured, and there is extensive periapical lucency adjacent to the roots of the tooth. This may be chronic based on the appearance, but clinical correlation is recommended to exclude an acutely fractured tooth. 3. Mild chronic microvascular ischemic changes in the cerebral white matter. 4. Multilevel degenerative disc disease and cervical  spondylosis, as above. Electronically Signed   By: Vinnie Langton M.D.   On: 06/02/2015 11:15   Ct Cervical Spine Wo Contrast  06/02/2015  CLINICAL DATA:  69 year old female with history of presumed fall to the floor (patient woke up on  the floor on her face this morning). Redness and swelling in the left cheek and orbit. Blurred vision in the left side. EXAM: CT HEAD WITHOUT CONTRAST CT MAXILLOFACIAL WITHOUT CONTRAST CT CERVICAL SPINE WITHOUT CONTRAST TECHNIQUE: Multidetector CT imaging of the head, cervical spine, and maxillofacial structures were performed using the standard protocol without intravenous contrast. Multiplanar CT image reconstructions of the cervical spine and maxillofacial structures were also generated. COMPARISON:  No priors. FINDINGS: CT HEAD FINDINGS Patchy and confluent areas of decreased attenuation are noted throughout the deep and periventricular white matter of the cerebral hemispheres bilaterally, compatible with chronic microvascular ischemic disease. No acute displaced skull fractures are identified. No acute intracranial abnormality. Specifically, no evidence of acute post-traumatic intracranial hemorrhage, no definite regions of acute/subacute cerebral ischemia, no focal mass, mass effect, hydrocephalus or abnormal intra or extra-axial fluid collections. The visualized paranasal sinuses and mastoids are well pneumatized. CT MAXILLOFACIAL FINDINGS Tooth number 13 appears fractured, and there is extensive periapical lucency adjacent to the roots of this tooth (whether not this is an acute injury or chronic is uncertain). No acute displaced facial bone fractures are identified. Specifically, pterygoid plates are intact. Mandible is intact, and mandibular condyles are located bilaterally. Minimal soft tissue swelling around the left orbit. Bilateral globes and retro-orbital soft tissues are grossly unremarkable in appearance. CT CERVICAL SPINE FINDINGS No acute displaced cervical  spine fractures. Reversal of cervical lordosis centered at the level of C4. Alignment is otherwise anatomic. Prevertebral soft tissues are normal. Multilevel degenerative disc disease, most severe at C4-C5, C5-C6 and C6-C7. Multilevel facet arthropathy with complete fusion of the left C2-C3 facets. The visualized portions of the upper thorax are unremarkable. IMPRESSION: 1. No evidence of significant acute traumatic injury to the skull, brain, or cervical spine. 2. Tooth number 13 is fractured, and there is extensive periapical lucency adjacent to the roots of the tooth. This may be chronic based on the appearance, but clinical correlation is recommended to exclude an acutely fractured tooth. 3. Mild chronic microvascular ischemic changes in the cerebral white matter. 4. Multilevel degenerative disc disease and cervical spondylosis, as above. Electronically Signed   By: Vinnie Langton M.D.   On: 06/02/2015 11:15   Ct Maxillofacial Wo Cm  06/02/2015  CLINICAL DATA:  69 year old female with history of presumed fall to the floor (patient woke up on the floor on her face this morning). Redness and swelling in the left cheek and orbit. Blurred vision in the left side. EXAM: CT HEAD WITHOUT CONTRAST CT MAXILLOFACIAL WITHOUT CONTRAST CT CERVICAL SPINE WITHOUT CONTRAST TECHNIQUE: Multidetector CT imaging of the head, cervical spine, and maxillofacial structures were performed using the standard protocol without intravenous contrast. Multiplanar CT image reconstructions of the cervical spine and maxillofacial structures were also generated. COMPARISON:  No priors. FINDINGS: CT HEAD FINDINGS Patchy and confluent areas of decreased attenuation are noted throughout the deep and periventricular white matter of the cerebral hemispheres bilaterally, compatible with chronic microvascular ischemic disease. No acute displaced skull fractures are identified. No acute intracranial abnormality. Specifically, no evidence of acute  post-traumatic intracranial hemorrhage, no definite regions of acute/subacute cerebral ischemia, no focal mass, mass effect, hydrocephalus or abnormal intra or extra-axial fluid collections. The visualized paranasal sinuses and mastoids are well pneumatized. CT MAXILLOFACIAL FINDINGS Tooth number 13 appears fractured, and there is extensive periapical lucency adjacent to the roots of this tooth (whether not this is an acute injury or chronic is uncertain). No acute displaced facial bone fractures are identified. Specifically, pterygoid  plates are intact. Mandible is intact, and mandibular condyles are located bilaterally. Minimal soft tissue swelling around the left orbit. Bilateral globes and retro-orbital soft tissues are grossly unremarkable in appearance. CT CERVICAL SPINE FINDINGS No acute displaced cervical spine fractures. Reversal of cervical lordosis centered at the level of C4. Alignment is otherwise anatomic. Prevertebral soft tissues are normal. Multilevel degenerative disc disease, most severe at C4-C5, C5-C6 and C6-C7. Multilevel facet arthropathy with complete fusion of the left C2-C3 facets. The visualized portions of the upper thorax are unremarkable. IMPRESSION: 1. No evidence of significant acute traumatic injury to the skull, brain, or cervical spine. 2. Tooth number 13 is fractured, and there is extensive periapical lucency adjacent to the roots of the tooth. This may be chronic based on the appearance, but clinical correlation is recommended to exclude an acutely fractured tooth. 3. Mild chronic microvascular ischemic changes in the cerebral white matter. 4. Multilevel degenerative disc disease and cervical spondylosis, as above. Electronically Signed   By: Vinnie Langton M.D.   On: 06/02/2015 11:15    I have personally reviewed and evaluated these images and lab results as part of my medical decision-making.   EKG Interpretation   Date/Time:  Tuesday June 02 2015 09:14:49  EDT Ventricular Rate:  85 PR Interval:  174 QRS Duration: 78 QT Interval:  373 QTC Calculation: 443 R Axis:   10 Text Interpretation:  Sinus rhythm Low voltage, extremity and precordial  leads Baseline wander in lead(s) V1 Confirmed by Alvino Chapel  MD, Ovid Curd  [id number]) on 06/02/2015 9:41:19 AM      MDM   Final diagnoses:  Fall, initial encounter  Eye injury, left, initial encounter    Patient with fall during the middle of the night. Question syncopal episode. Patient denies any associated chest pain or shortness breath. However, she does not remember falling. She states that she has had frequent episodes of dizziness. She is uncertain if she tripped on something. She states that she only remembers waking up next to the bedpost. She has a left-sided head injury with swelling about the left orbit and a left pupil deformity, will check head CT and maxillofacial CT.  CTs are reassuring.  Details as above.  Patient seen by and discussed with Dr. Alvino Chapel, who recommends consultation with ophthalmology.  Discussed MOI with Dr. Alvino Chapel, suspect this is mechanical fall related, and will DC per ophthalmology's recommendations.  Discussed with Dr. Talbert Forest and Dr. Manuella Ghazi from ophthalmology.  Dr. Manuella Ghazi to see patient in ED.  Received phone call back from Dr. Talbert Forest, who spoke with Dr. Manuella Ghazi.  Patient to be sent straight to Dr. Talbert Forest' office for evaluation.  Discussed with Dr. Alvino Chapel, who agrees with the plan.  Patient understands and agrees with plan.   Montine Circle, PA-C 06/02/15 [place], MD 06/03/15 [phone]

## 2015-06-02 NOTE — ED Notes (Signed)
Pt to ED due to fall in the middle of the night last night. Pt reports recent episodes of dizziness, PCP recommended pt to see neurologist, pt states she has been putting it off. Pt does not remember falling, only remembers getting up off the floor. Pt has obvious injuries to left orbit and left cheek, pt woke up on the floor on face. Pt reports blurred vision in left eye at this time. Denies CP or SOB.

## 2015-06-02 NOTE — Discharge Instructions (Signed)
Please go to Dr. Talbert Forest' office now to be seen.  You do not have any broken bones on your CT scans today.  They do show a cracked tooth which may not be new.  Please see a dentist for this.  Fall Prevention in the Home  Falls can cause injuries and can affect people from all age groups. There are many simple things that you can do to make your home safe and to help prevent falls. WHAT CAN I DO ON THE OUTSIDE OF MY HOME?  Regularly repair the edges of walkways and driveways and fix any cracks.  Remove high doorway thresholds.  Trim any shrubbery on the main path into your home.  Use bright outdoor lighting.  Clear walkways of debris and clutter, including tools and rocks.  Regularly check that handrails are securely fastened and in good repair. Both sides of any steps should have handrails.  Install guardrails along the edges of any raised decks or porches.  Have leaves, snow, and ice cleared regularly.  Use sand or salt on walkways during winter months.  In the garage, clean up any spills right away, including grease or oil spills. WHAT CAN I DO IN THE BATHROOM?  Use night lights.  Install grab bars by the toilet and in the tub and shower. Do not use towel bars as grab bars.  Use non-skid mats or decals on the floor of the tub or shower.  If you need to sit down while you are in the shower, use a plastic, non-slip stool.Marland Kitchen  Keep the floor dry. Immediately clean up any water that spills on the floor.  Remove soap buildup in the tub or shower on a regular basis.  Attach bath mats securely with double-sided non-slip rug tape.  Remove throw rugs and other tripping hazards from the floor. WHAT CAN I DO IN THE BEDROOM?  Use night lights.  Make sure that a bedside light is easy to reach.  Do not use oversized bedding that drapes onto the floor.  Have a firm chair that has side arms to use for getting dressed.  Remove throw rugs and other tripping hazards from the  floor. WHAT CAN I DO IN THE KITCHEN?   Clean up any spills right away.  Avoid walking on wet floors.  Place frequently used items in easy-to-reach places.  If you need to reach for something above you, use a sturdy step stool that has a grab bar.  Keep electrical cables out of the way.  Do not use floor polish or wax that makes floors slippery. If you have to use wax, make sure that it is non-skid floor wax.  Remove throw rugs and other tripping hazards from the floor. WHAT CAN I DO IN THE STAIRWAYS?  Do not leave any items on the stairs.  Make sure that there are handrails on both sides of the stairs. Fix handrails that are broken or loose. Make sure that handrails are as long as the stairways.  Check any carpeting to make sure that it is firmly attached to the stairs. Fix any carpet that is loose or worn.  Avoid having throw rugs at the top or bottom of stairways, or secure the rugs with carpet tape to prevent them from moving.  Make sure that you have a light switch at the top of the stairs and the bottom of the stairs. If you do not have them, have them installed. WHAT ARE SOME OTHER FALL PREVENTION TIPS?  Wear closed-toe shoes  that fit well and support your feet. Wear shoes that have rubber soles or low heels.  When you use a stepladder, make sure that it is completely opened and that the sides are firmly locked. Have someone hold the ladder while you are using it. Do not climb a closed stepladder.  Add color or contrast paint or tape to grab bars and handrails in your home. Place contrasting color strips on the first and last steps.  Use mobility aids as needed, such as canes, walkers, scooters, and crutches.  Turn on lights if it is dark. Replace any light bulbs that burn out.  Set up furniture so that there are clear paths. Keep the furniture in the same spot.  Fix any uneven floor surfaces.  Choose a carpet design that does not hide the edge of steps of a  stairway.  Be aware of any and all pets.  Review your medicines with your healthcare provider. Some medicines can cause dizziness or changes in blood pressure, which increase your risk of falling. Talk with your health care provider about other ways that you can decrease your risk of falls. This may include working with a physical therapist or trainer to improve your strength, balance, and endurance.   This information is not intended to replace advice given to you by your health care provider. Make sure you discuss any questions you have with your health care provider.   Document Released: 07/08/2002 Document Revised: 12/02/2014 Document Reviewed: 08/22/2014 Elsevier Interactive Patient Education [employer].

## 2015-06-22 ENCOUNTER — Telehealth: Payer: Self-pay | Admitting: Pulmonary Disease

## 2015-06-22 DIAGNOSIS — R2681 Unsteadiness on feet: Secondary | ICD-10-CM

## 2015-06-22 NOTE — Telephone Encounter (Signed)
Per SN>> Ok to place referral for neuro eval for unsteady gait and recent falls  Order placed  Pt called and notified of referral  Nothing further is needed

## 2015-06-22 NOTE — Telephone Encounter (Signed)
Called spoke with pt. She is requesting a referral to a neurologists.  She has been unsteady on her feet. She ended up in the ED 11/1 from a fall. Please advise Dr. Lenna Gilford thanks

## 2015-07-01 DIAGNOSIS — S0532XA Ocular laceration without prolapse or loss of intraocular tissue, left eye, initial encounter: Secondary | ICD-10-CM | POA: Diagnosis not present

## 2015-07-25 ENCOUNTER — Other Ambulatory Visit: Payer: Self-pay | Admitting: Pulmonary Disease

## 2015-07-30 ENCOUNTER — Other Ambulatory Visit: Payer: Self-pay | Admitting: Pulmonary Disease

## 2015-07-31 ENCOUNTER — Encounter: Payer: Self-pay | Admitting: Neurology

## 2015-07-31 ENCOUNTER — Ambulatory Visit (INDEPENDENT_AMBULATORY_CARE_PROVIDER_SITE_OTHER): Payer: Medicare Other | Admitting: Neurology

## 2015-07-31 VITALS — BP 160/88 | HR 91 | Ht 61.0 in | Wt 220.4 lb

## 2015-07-31 DIAGNOSIS — R29898 Other symptoms and signs involving the musculoskeletal system: Secondary | ICD-10-CM | POA: Diagnosis not present

## 2015-07-31 DIAGNOSIS — R42 Dizziness and giddiness: Secondary | ICD-10-CM | POA: Diagnosis not present

## 2015-07-31 DIAGNOSIS — R55 Syncope and collapse: Secondary | ICD-10-CM | POA: Diagnosis not present

## 2015-07-31 DIAGNOSIS — I10 Essential (primary) hypertension: Secondary | ICD-10-CM | POA: Diagnosis not present

## 2015-07-31 NOTE — Progress Notes (Addendum)
NEUROLOGY CONSULTATION NOTE  TU FERENCE MRN: [id number] DOB: 10/01/1945  Referring provider: Dr. Lenna Gilford Primary care provider: Dr. Lenna Gilford  Reason for consult:  dizziness  HISTORY OF PRESENT ILLNESS: Cynthia [name] is a 69 year old right-handed female with hypertension, cataract (status post bilateral surgery), venous insufficiency, degenerative joint disease, lumbar back pain status post laminectomy, diabetes, and morbid obesity who presents for dizziness.  History obtained by patient and ED note.  Labs and CT imaging of head, maxillofacial and cervical spine reviewed.  For several years, she has experienced dizziness.  The dizziness is constant but intensifies with change in position or head movement.  She describes it as a constant sensation of movement.  When movement or change in position occurs, it can intensify to a spinning sensation.  Unless she is looking straight, she will feel significantly unsteady.  One time, she turned her head while walking, became increasingly dizzy, and fell.  She reports left greater than left tinnitus.  There is no associated headache, diplopia, nausea, slurred speech, hearing loss, dysphagia, or focal numbness.  She feels that the dizziness has gotten worse over the past couple of months.  On the night of 06/01/15, she got up to go to the bathroom.  She remembered walking towards the bathroom but the next thing she remembers, she was on the floor with a laceration above her eye.  She denied any preceding symptoms, such as lightheadedness, chest pain or palpitations.  She was not incontinent and did not bite her tongue.  When she woke up, she was lucid and not disoriented.  The next morning she presented to the ED for evaluation.  CT of head, maxillofacial showed mild tissue swelling around the left orbit but no fracture or bleed.  CT cervical spine showed multilevel degenerative disc disease and spondylosis but no fractures.  Labs, including CBC, BMP and troponins,  were unremarkable except for elevated serum glucose of 154.  EKG was unremarkable.  She has never been evaluated for the dizziness.  She denies prior history of head trauma, stroke or seizure.  On exam, she exhibits left leg weakness, which she says is chronic but has never mentioned it to anyone.  She has remote history of lumbar laminectomy but denies current back or leg pain.  The weakness is unchanged.   PAST MEDICAL HISTORY: Past Medical History  Diagnosis Date  . Asthmatic bronchitis   . Hypertension   . Venous insufficiency   . Hypercholesteremia   . Diabetes mellitus   . Morbid obesity ([place])   . Colonic polyp   . Degenerative joint disease   . Lumbar back pain   . Headache(784.0)   . Anxiety disorder     PAST SURGICAL HISTORY: Past Surgical History  Procedure Laterality Date  . Pelvic abcess drainage  1970    Dr. Marjory Sneddon  . Lumbar laminectomy  1997    Dr. Quentin Cornwall  . Colonoscopy    . Polypectomy    . Carpal tunnel release    . Dilation and curettage of uterus      MEDICATIONS: Current Outpatient Prescriptions on File Prior to Visit  Medication Sig Dispense Refill  . aspirin EC 81 MG tablet Take 81 mg by mouth at bedtime.    . Calcium Carbonate Antacid (TUMS E-X PO) Take by mouth as needed.    . cholecalciferol (VITAMIN D) 1000 UNITS tablet Take 2,000 Units by mouth daily.     . famotidine (PEPCID) 10 MG tablet Take 10 mg  by mouth as needed.    . ferrous sulfate 325 (65 FE) MG tablet Take 325 mg by mouth daily with breakfast.      . hydrochlorothiazide (HYDRODIURIL) 25 MG tablet TAKE 1/2 TABLET BY MOUTH DAILY 45 tablet 0  . lisinopril (PRINIVIL,ZESTRIL) 20 MG tablet Take 1 tablet (20 mg total) by mouth daily. (Patient taking differently: Take 20 mg by mouth at bedtime. ) 90 tablet 0  . metFORMIN (GLUCOPHAGE) 500 MG tablet TAKE 1 TABLET BY MOUTH TWICE DAILY WITH A MEAL 180 tablet 0  . Multiple Vitamins-Minerals (MULTIPLE VITAMINS/WOMENS PO) Take 1 tablet by  mouth daily.      . naproxen sodium (ANAPROX) 220 MG tablet Take 440 mg by mouth daily as needed (back pain).     . potassium chloride SA (K-DUR,KLOR-CON) 20 MEQ tablet Take 1 tablet (20 mEq total) by mouth daily. 90 tablet 0  . simvastatin (ZOCOR) 40 MG tablet Take 1 tablet (40 mg total) by mouth at bedtime. 90 tablet 0  . vitamin B-12 (CYANOCOBALAMIN) 1000 MCG tablet Take 1,000 mcg by mouth daily.     No current facility-administered medications on file prior to visit.    ALLERGIES: No Known Allergies  FAMILY HISTORY: Family History  Problem Relation Age of Onset  . Heart disease Mother   . Diabetes Mother   . Heart disease Father   . Diabetes      sibling  . Diabetes Brother   . Diabetes Maternal Grandmother   . Diabetes Paternal Grandmother   . Diabetes Brother     SOCIAL HISTORY: Social History   Social History  . Marital Status: Divorced    Spouse Name: N/A  . Number of Children: N/A  . Years of Education: N/A   Occupational History  . retired    Social History Main Topics  . Smoking status: Former Smoker -- 0.40 packs/day for 6 years    Types: Cigarettes    Quit date: 08/01/1978  . Smokeless tobacco: Never Used  . Alcohol Use: No  . Drug Use: No  . Sexual Activity: Not on file   Other Topics Concern  . Not on file   Social History Narrative   Lives alone in an apartment on the second floor.  Has no children.  Retired Sales promotion account executive.  Education: high school.    REVIEW OF SYSTEMS: Constitutional: No fevers, chills, or sweats, no generalized fatigue, change in appetite Eyes: No visual changes, double vision, eye pain Ear, nose and throat: No hearing loss, ear pain, nasal congestion, sore throat Cardiovascular: No chest pain, palpitations Respiratory:  No shortness of breath at rest or with exertion, wheezes GastrointestinaI: No nausea, vomiting, diarrhea, abdominal pain, fecal incontinence Genitourinary:  No dysuria, urinary retention or  frequency Musculoskeletal:  No neck pain, back pain Integumentary: No rash, pruritus, skin lesions Neurological: as above Psychiatric: No depression, insomnia, anxiety Endocrine: No palpitations, fatigue, diaphoresis, mood swings, change in appetite, change in weight, increased thirst Hematologic/Lymphatic:  No anemia, purpura, petechiae. Allergic/Immunologic: no itchy/runny eyes, nasal congestion, recent allergic reactions, rashes  PHYSICAL EXAM: Filed Vitals:   07/31/15 0959  BP: 160/88  Pulse: 91   General: No acute distress.  Patient appears well-groomed.   Head:  Normocephalic/atraumatic Eyes:  fundi not visualized on inspection Neck: supple, no paraspinal tenderness, full range of motion Back: No paraspinal tenderness Heart: regular rate and rhythm Lungs: Clear to auscultation bilaterally. Vascular: No carotid bruits. Neurological Exam: Mental status: alert and oriented to person, place, and time,  recent and remote memory intact, fund of knowledge intact, attention and concentration intact, speech fluent and not dysarthric, language intact. Cranial nerves: CN I: not tested CN II: leftt surgical irregular pupil and round right pupil and reactive to light, visual fields intact CN III, IV, VI:  full range of motion, no nystagmus, no ptosis CN V: facial sensation intact CN VII: upper and lower face symmetric CN VIII: hearing intact CN IX, X: gag intact, uvula midline CN XI: sternocleidomastoid and trapezius muscles intact CN XII: tongue midline Bulk & Tone: normal, no fasciculations. Motor:  4+/5 left hip flexion and 5-/5 left knee extension.  Otherwise, 5/5. Sensation:  Pinprick and vibration sensation intact. Deep Tendon Reflexes:  2+ throughout, toes downgoing. Finger to nose testing:  Without dysmetria.  Heel to shin:  Without dysmetria.  Gait:  Wide-based, stumbles a bit.  Unable to tandem walk. Romberg positive. Tenderness to palpation of left  hip  IMPRESSION: Vertigo.  Unclear etiology, given that it is a constant chronic symptom. Episode of syncope or loss of consciousness.  Unclear etiology.  It does not sound typical for seizure.  Not sure if it could have been due to orthostatic hypotension Left leg weakness.  It could be in the distribution of the left L3/L4 myotome.  She does have remote history of lumbar spine disease.  She has tenderness to palpation of left hip, so consider hip pathology as well. Morbid obesity HTN  PLAN: 1. Given the symptoms of vertigo, loss of consciousness, and left leg weakness, must get MRI of brain w/wo and MRA of head to rule out posterior fossa etiology/vertebrobasilar insufficiency.  If unremarkable, recommend vestibular rehab 2.  To evaluate left leg weakness, MRI of lumbar spine is necessary to rule out a herniated disc causing nerve root compression.  If unremarkable, would likely recommend PT or consider evaluation of hip pathology 3.   Will get EEG to further evaluate loss of consciousness.  Also informed patient of [place] law stating that a person should not drive for 6 months if they had an episode of loss of consciousness of undetermined etiology. 4.   Weight loss 5.  BP elevated today, should be followed up and rechecked with PCP  Thank you for allowing me to take part in the care of this patient.  Cynthia Clines, DO  CC:  Teressa Lower, MD

## 2015-07-31 NOTE — Addendum Note (Signed)
Addended by: Chester Holstein on: 07/31/2015 11:55 AM   Modules accepted: Orders

## 2015-07-31 NOTE — Patient Instructions (Signed)
1.  For vertigo, left leg weakness, and loss of consciousness, we will get MRI of brain and MRA of head.  If unremarkable, will recommend vestibular rehabilitation 2.  For left leg weakness, will get MRI of lumbar spine.  If MRI does not show cause of weakness, recommend physical therapy 3.  [place] law states you should not drive for 6 months after unclear episode of loss of consciousness

## 2015-08-10 ENCOUNTER — Ambulatory Visit
Admission: RE | Admit: 2015-08-10 | Discharge: 2015-08-10 | Disposition: A | Discharge status: Home / Self Care | Payer: Medicare Other | Source: Ambulatory Visit | Attending: Neurology | Admitting: Neurology

## 2015-08-10 DIAGNOSIS — R42 Dizziness and giddiness: Secondary | ICD-10-CM | POA: Diagnosis not present

## 2015-08-10 DIAGNOSIS — R269 Unspecified abnormalities of gait and mobility: Secondary | ICD-10-CM | POA: Diagnosis not present

## 2015-08-10 DIAGNOSIS — R29898 Other symptoms and signs involving the musculoskeletal system: Secondary | ICD-10-CM

## 2015-08-10 DIAGNOSIS — R55 Syncope and collapse: Secondary | ICD-10-CM

## 2015-08-10 DIAGNOSIS — M5136 Other intervertebral disc degeneration, lumbar region: Secondary | ICD-10-CM | POA: Diagnosis not present

## 2015-08-10 MED ORDER — GADOBENATE DIMEGLUMINE 529 MG/ML IV SOLN: 20.0000 mL | Freq: Once | INTRAVENOUS | Status: DC | PRN

## 2015-08-11 ENCOUNTER — Telehealth: Payer: Self-pay

## 2015-08-11 NOTE — Telephone Encounter (Signed)
-----   Message from Pieter Partridge, DO sent at 08/11/2015 11:27 AM EST ----- MRI and MRA of head show no cause in the brain for dizziness and balance problems.  I do not have a clear explanation for the dizziness at this time.  MRI of lumbar spine shows no evidence of pinched nerves.  I suspect leg weakness likely old from prior back problems.

## 2015-08-11 NOTE — Telephone Encounter (Signed)
Message relayed to patient. Verbalized understanding and denied questions.   

## 2015-08-17 ENCOUNTER — Other Ambulatory Visit: Payer: Medicare Other

## 2015-08-17 ENCOUNTER — Ambulatory Visit: Payer: Medicare Other | Admitting: Neurology

## 2015-08-17 DIAGNOSIS — R55 Syncope and collapse: Secondary | ICD-10-CM

## 2015-08-17 DIAGNOSIS — R42 Dizziness and giddiness: Secondary | ICD-10-CM

## 2015-08-17 DIAGNOSIS — R29898 Other symptoms and signs involving the musculoskeletal system: Secondary | ICD-10-CM

## 2015-08-17 DIAGNOSIS — I10 Essential (primary) hypertension: Secondary | ICD-10-CM

## 2015-08-22 ENCOUNTER — Other Ambulatory Visit: Payer: Self-pay | Admitting: Pulmonary Disease

## 2015-08-25 ENCOUNTER — Other Ambulatory Visit: Payer: Self-pay | Admitting: Pulmonary Disease

## 2015-08-27 ENCOUNTER — Telehealth: Payer: Self-pay | Admitting: Pulmonary Disease

## 2015-08-27 MED ORDER — METFORMIN HCL 500 MG PO TABS: ORAL_TABLET | ORAL | Status: DC

## 2015-08-27 MED ORDER — LISINOPRIL 20 MG PO TABS: ORAL_TABLET | ORAL | Status: DC

## 2015-08-27 MED ORDER — POTASSIUM CHLORIDE CRYS ER 20 MEQ PO TBCR: EXTENDED_RELEASE_TABLET | ORAL | Status: DC

## 2015-08-27 NOTE — Telephone Encounter (Signed)
Spoke with pt to verify msg  Rx refills sent to pharm  Nothing further needed

## 2015-11-12 DIAGNOSIS — E119 Type 2 diabetes mellitus without complications: Secondary | ICD-10-CM | POA: Diagnosis not present

## 2015-11-19 ENCOUNTER — Other Ambulatory Visit: Payer: Self-pay | Admitting: Pulmonary Disease

## 2016-02-15 ENCOUNTER — Other Ambulatory Visit: Payer: Self-pay | Admitting: Pulmonary Disease

## 2016-05-12 ENCOUNTER — Telehealth: Payer: Self-pay | Admitting: Pulmonary Disease

## 2016-05-12 MED ORDER — SIMVASTATIN 40 MG PO TABS: 40.0000 mg | ORAL_TABLET | Freq: Every day | ORAL | 0 refills | Status: DC

## 2016-05-12 NOTE — Telephone Encounter (Signed)
Refill of simvastatin has been sent to the pharmacy. Nothing further is needed.

## 2016-05-14 ENCOUNTER — Other Ambulatory Visit: Payer: Self-pay | Admitting: Pulmonary Disease

## 2016-05-24 ENCOUNTER — Telehealth: Payer: Self-pay | Admitting: Pulmonary Disease

## 2016-05-24 DIAGNOSIS — I10 Essential (primary) hypertension: Secondary | ICD-10-CM

## 2016-05-24 DIAGNOSIS — M150 Primary generalized (osteo)arthritis: Secondary | ICD-10-CM

## 2016-05-24 DIAGNOSIS — E7800 Pure hypercholesterolemia, unspecified: Secondary | ICD-10-CM

## 2016-05-24 DIAGNOSIS — E0590 Thyrotoxicosis, unspecified without thyrotoxic crisis or storm: Secondary | ICD-10-CM

## 2016-05-24 DIAGNOSIS — E119 Type 2 diabetes mellitus without complications: Secondary | ICD-10-CM

## 2016-05-24 DIAGNOSIS — M159 Polyosteoarthritis, unspecified: Secondary | ICD-10-CM

## 2016-05-24 NOTE — Telephone Encounter (Signed)
SN pt is schedule to see you for her yearly follow up on 11/7 and would like to come in next week for her lab work.  Please advise if ok to place these orders. thanks

## 2016-05-25 NOTE — Telephone Encounter (Signed)
Per SN---  Ok for pt to come in for labs.  FLP, CMET, CBCD, TSH, VIT D.  These have been entered for the pt and pt is aware.

## 2016-05-31 ENCOUNTER — Ambulatory Visit: Payer: Medicare Other | Admitting: Pulmonary Disease

## 2016-06-01 ENCOUNTER — Other Ambulatory Visit (INDEPENDENT_AMBULATORY_CARE_PROVIDER_SITE_OTHER): Payer: Medicare Other

## 2016-06-01 DIAGNOSIS — I10 Essential (primary) hypertension: Secondary | ICD-10-CM | POA: Diagnosis not present

## 2016-06-01 DIAGNOSIS — M150 Primary generalized (osteo)arthritis: Secondary | ICD-10-CM | POA: Diagnosis not present

## 2016-06-01 DIAGNOSIS — E7800 Pure hypercholesterolemia, unspecified: Secondary | ICD-10-CM | POA: Diagnosis not present

## 2016-06-01 DIAGNOSIS — M159 Polyosteoarthritis, unspecified: Secondary | ICD-10-CM

## 2016-06-01 DIAGNOSIS — E119 Type 2 diabetes mellitus without complications: Secondary | ICD-10-CM | POA: Diagnosis not present

## 2016-06-01 DIAGNOSIS — E0590 Thyrotoxicosis, unspecified without thyrotoxic crisis or storm: Secondary | ICD-10-CM | POA: Diagnosis not present

## 2016-06-01 LAB — COMPREHENSIVE METABOLIC PANEL
ALBUMIN: 4.4 g/dL (ref 3.5–5.2)
ALK PHOS: 64 U/L (ref 39–117)
ALT: 42 U/L — AB (ref 0–35)
AST: 57 U/L — AB (ref 0–37)
BUN: 11 mg/dL (ref 6–23)
CALCIUM: 9.6 mg/dL (ref 8.4–10.5)
CO2: 24 meq/L (ref 19–32)
CREATININE: 0.75 mg/dL (ref 0.40–1.20)
Chloride: 92 mEq/L — ABNORMAL LOW (ref 96–112)
GFR: 81.58 mL/min (ref >60.00)
Glucose, Bld: 185 mg/dL — ABNORMAL HIGH (ref 70–99)
Potassium: 3.9 mEq/L (ref 3.5–5.1)
Sodium: 130 mEq/L — ABNORMAL LOW (ref 135–145)
Total Bilirubin: 0.5 mg/dL (ref 0.2–1.2)
Total Protein: 7.4 g/dL (ref 6.0–8.3)

## 2016-06-01 LAB — CBC WITH DIFFERENTIAL/PLATELET
BASOS ABS: 0 10*3/uL (ref 0.0–0.1)
Basophils Relative: 0.3 % (ref 0.0–3.0)
EOS ABS: 0.1 10*3/uL (ref 0.0–0.7)
Eosinophils Relative: 1.8 % (ref 0.0–5.0)
HEMATOCRIT: 40.3 % (ref 36.0–46.0)
Hemoglobin: 13.9 g/dL (ref 12.0–15.0)
LYMPHS PCT: 18.8 % (ref 12.0–46.0)
Lymphs Abs: 1.5 10*3/uL (ref 0.7–4.0)
MCHC: 34.3 g/dL (ref 30.0–36.0)
MCV: 91.7 fl (ref 78.0–100.0)
Monocytes Absolute: 0.5 10*3/uL (ref 0.1–1.0)
Monocytes Relative: 6.2 % (ref 3.0–12.0)
NEUTROS ABS: 5.8 10*3/uL (ref 1.4–7.7)
Neutrophils Relative %: 72.9 % (ref 43.0–77.0)
PLATELETS: 259 10*3/uL (ref 150.0–400.0)
RBC: 4.4 Mil/uL (ref 3.87–5.11)
RDW: 14.5 % (ref 11.5–15.5)
WBC: 8 10*3/uL (ref 4.0–10.5)

## 2016-06-01 LAB — LIPID PANEL
CHOLESTEROL: 170 mg/dL (ref 0–200)
HDL: 62.5 mg/dL (ref >39.00)
NonHDL: 107.1
Total CHOL/HDL Ratio: 3
Triglycerides: 236 mg/dL — ABNORMAL HIGH (ref 0.0–149.0)
VLDL: 47.2 mg/dL — ABNORMAL HIGH (ref 0.0–40.0)

## 2016-06-01 LAB — TSH: TSH: 0.82 u[IU]/mL (ref 0.35–4.50)

## 2016-06-01 LAB — VITAMIN D 25 HYDROXY (VIT D DEFICIENCY, FRACTURES): VITD: 28.2 ng/mL — AB (ref 30.00–100.00)

## 2016-06-01 LAB — LDL CHOLESTEROL, DIRECT: Direct LDL: 88 mg/dL

## 2016-06-01 LAB — HEMOGLOBIN A1C: HEMOGLOBIN A1C: 7.1 % — AB (ref 4.6–6.5)

## 2016-06-07 ENCOUNTER — Encounter: Payer: Self-pay | Admitting: Pulmonary Disease

## 2016-06-07 ENCOUNTER — Ambulatory Visit (INDEPENDENT_AMBULATORY_CARE_PROVIDER_SITE_OTHER)
Admission: RE | Admit: 2016-06-07 | Discharge: 2016-06-07 | Disposition: A | Discharge status: Home / Self Care | Payer: Medicare Other | Source: Ambulatory Visit | Attending: Pulmonary Disease | Admitting: Pulmonary Disease

## 2016-06-07 ENCOUNTER — Ambulatory Visit: Payer: Medicare Other | Admitting: Pulmonary Disease

## 2016-06-07 VITALS — BP 140/82 | HR 101 | Temp 96.8°F | Ht 61.0 in | Wt 222.5 lb

## 2016-06-07 DIAGNOSIS — M159 Polyosteoarthritis, unspecified: Secondary | ICD-10-CM

## 2016-06-07 DIAGNOSIS — M545 Low back pain, unspecified: Secondary | ICD-10-CM

## 2016-06-07 DIAGNOSIS — I10 Essential (primary) hypertension: Secondary | ICD-10-CM | POA: Diagnosis not present

## 2016-06-07 DIAGNOSIS — F411 Generalized anxiety disorder: Secondary | ICD-10-CM

## 2016-06-07 DIAGNOSIS — J209 Acute bronchitis, unspecified: Secondary | ICD-10-CM

## 2016-06-07 DIAGNOSIS — E7800 Pure hypercholesterolemia, unspecified: Secondary | ICD-10-CM

## 2016-06-07 DIAGNOSIS — E119 Type 2 diabetes mellitus without complications: Secondary | ICD-10-CM

## 2016-06-07 DIAGNOSIS — Z9289 Personal history of other medical treatment: Secondary | ICD-10-CM

## 2016-06-07 DIAGNOSIS — M150 Primary generalized (osteo)arthritis: Secondary | ICD-10-CM

## 2016-06-07 DIAGNOSIS — I872 Venous insufficiency (chronic) (peripheral): Secondary | ICD-10-CM

## 2016-06-07 DIAGNOSIS — D126 Benign neoplasm of colon, unspecified: Secondary | ICD-10-CM

## 2016-06-07 DIAGNOSIS — G8929 Other chronic pain: Secondary | ICD-10-CM

## 2016-06-07 MED ORDER — LISINOPRIL 20 MG PO TABS: ORAL_TABLET | ORAL | 3 refills | Status: DC

## 2016-06-07 MED ORDER — SIMVASTATIN 40 MG PO TABS: 40.0000 mg | ORAL_TABLET | Freq: Every day | ORAL | 3 refills | Status: AC

## 2016-06-07 MED ORDER — METFORMIN HCL 500 MG PO TABS: ORAL_TABLET | ORAL | 3 refills | Status: DC

## 2016-06-07 MED ORDER — POTASSIUM CHLORIDE CRYS ER 20 MEQ PO TBCR: EXTENDED_RELEASE_TABLET | ORAL | 3 refills | Status: DC

## 2016-06-07 MED ORDER — HYDROCHLOROTHIAZIDE 25 MG PO TABS: 12.5000 mg | ORAL_TABLET | Freq: Every day | ORAL | 3 refills | Status: DC

## 2016-06-07 NOTE — Progress Notes (Signed)
Subjective:    Patient ID: Cynthia [name], female    DOB: [birth year], 70 y.o.   MRN: [id number]  HPI 70 y/o WF here for a follow up visit... she has multiple medical problems as noted below...  ~  SEE PREV EPIC NOTES FOR OLDER DATA >>    LABS 10/13:  FLP not at goal on Simva20 w/ TG=286 LDL=114;  Chems- ok x BS=138 A1c=6.2 LFTs=sl elev;  CBC- wnl;  TSH=0.81   ~  May 30, 2013:  Yearly [place] says she's had a good yr- no new complaints or concerns; she is now retired;     HBP> on ASA81, Lisinopril20, HCT25, K20; BP= 134/70 & she feels well w/o CP, palpit, dizzy, SOB, edema, etc; still needs to incr exercise!    Ven Insuffic> on low sodium diet + the diuretic; reminded of elevation & support hose; she denies swelling...    Chol> on Simva40 now; FLP 10/14 shows TChol 177, TG 185, HDL 66, LDL 74; this is much improved, but needs better low fat diet...    DM> on Metform500Bid & Glimep1mg -1/2 tabQam; BS=146, A1c=6.1; Stable- continue same & the wt loss program!    Obesity> wt=217# which is down 8# further in the last yr; we reviewed the necessary diet & exercise program for additional wt reduction...    GI- colon polyps> she had 3 adenomatous polyps removed 11/09 by DrDBrodie; f/u colon 12/13 showed one sm sessile polyp in sigmoid, Bx=hyperplastic, she said f/u 70yrs.    LBP> she denies back pain but has some left hip discomfort w/ walking; rec OTC analgesics as needed...    Anxiety> on Alprazolam0.5mg  prn... We reviewed prob list, meds, xrays and labs> see below for updates >> OK 2014 Flu shot today & 90 day refill Rxs...  LABS 10/14:  FLP- ok on Simva40 x TG=185;  Chems- ok w/ BS=146 Ca=10.4 SGOT=40 A1c=6.1;  CBC= wnl;  TSH=0.61...  ~  May 30, 2014:  Yearly ROV & Cynthia [name] tells me that she has a trigger finger on her left hand & may need surg per DrGramig; We reviewed the following medical problems during today's office visit >>     HBP> on ASA81, Lisinopril20, HCT25, K20; BP= 128/80 & she  feels well w/o CP, palpit, dizzy, SOB, edema, etc; still needs diet & exercise!    Ven Insuffic> on low sodium diet + the diuretic; reminded of elevation & support hose; she denies swelling...    Chol> on Simva40; FLP 10/15 shows TChol 155, TG 200, HDL 61, LDL 54; she needs better low fat diet & wt reduction...    DM> on Metform500Bid & off Glimep; Labs 10/15 showed BS=121, A1c=6.1; Stable- continue same & the wt loss program!    Obesity> wt=221# which is up 4# this past yr; we reviewed the necessary diet & exercise program to be successful w/ wt reduction...    GI- colon polyps> she had 3 adenomatous polyps removed 11/09 by DrDBrodie; f/u colon 12/13 showed one sm sessile polyp in sigmoid, Bx=hyperplastic, she said f/u 70yrs.    LBP> she denies back pain but has some left hip discomfort w/ walking; rec OTC analgesics as needed...    Anxiety> on Alprazolam0.5mg  prn... We reviewed prob list, meds, xrays and labs> see below for updates >> she had the 2015 Flu vaccine 9/15; given refills per request...   CXR 10/15 showed norm heart size, clear lungs, NAD.Marland KitchenMarland Kitchen  EKG 10/15 showed NSR, rate 78, low voltage & poor  r progression...   LABS 10/15:  FLP- Chol is ok but TG=200 & needs wt reduction;  Chems- ok x BS=121 A1c=6.1;  CBC- wnl;  TSH=0.69...  ~  June 01, 2015:  Yearly ROV & medical follow up visit> Cynthia [name] reports a good yr, no new complaints or concerns; then she mentions occas dizzy- off & on, eg if I'm walking straight & look to the side; no cerebral ischemic symptoms, I offered Neuro referral for further eval & scan but she declines at this time and will let me know if it gets worse...     HBP> on ASA81, Lisinopril20, HCT25-1/2, K20; BP= 140/78 & she feels well w/o CP, palpit, dizzy, SOB, edema, etc; still needs diet & exercise!    Ven Insuffic> on low sodium diet + the diuretic; reminded of elevation & support hose; she denies swelling...    Chol> on Simva40; FLP 10/16 shows TChol 223, TG 298, HDL  58, LDL 135; states she taking med regularly- needs better low fat diet & wt reduction...    DM> on Metform500Bid & off Glimep; Labs 10/16 showed BS=161, A1c=6.4; Stable- continue same med & the wt loss program!    Obesity> wt=213# which is down 8# this past yr; we reviewed the necessary diet & exercise program to be successful w/ wt reduction...    GI- colon polyps> she had 3 adenomatous polyps removed 11/09 by DrDBrodie; f/u colon 12/13 showed one sm sessile polyp in sigmoid, Bx=hyperplastic, she said f/u 70yrs.    LBP> she denies back pain but has some left hip discomfort w/ walking; rec OTC analgesics as needed (Aleve)...    Anxiety> on Alprazolam0.5mg  prn... We reviewed prob list, meds, xrays and labs> she takes numerous supplements- calcium, MVI, VitD, Vit B12, Fe...  LABS 10/16:  FLP- way off, states she's taking med daily, needs better low chol/ low fat diet;  Chems- ok x BS=161, A1c=6.4;  CBC- wnl;  TSH=0.74... IMP/PLAN>>  Generally stable but BMI still ~40 & needs better diet/ exercise/ wt reduction strategies;  In the interim, rec to take meds regularly every day, concentrate on food w/ low glycemic index...  ~  June 07, 2016:  Yearly ROV & general medical follow up visit>       BMD done at [facility] 06/17/16>  Tscares all wnl- owest Tscore -0.70 in left [facility] & pt rec to continue Ca, womensMVI, VitD 1000u/d...            Problem List:  ASTHMATIC BRONCHITIS, ACUTE (ICD-466.0) - no recent exas... ~  CXR 10/15 showed norm heart size, clear lungs, NAD...  HYPERTENSION (ICD-401.9) - on ASA 81mg /d,  LISINOPRIL 20mg /d,  HCTZ 25mg /d,  & K20/d... ~  baseline EKG w/ poor R progression & NSSTTWA... ~  Cath in 9/04 was normal- norm coronaries, norm LVF, etc... rec for aggressive primary prevention. ~  10/12:  CXR showed normal heart size, clear lungs but sl low lung vol's;  EKG showed NSR, ?poor R progression, NAD... ~  10/13: on ASA81, Lisinopril20, HCT25, K20; BP= 130/70 & she feels  well w/o CP, palpit, dizzy, SOB, edema, etc... ~  10/14: on ASA81, Lisinopril20, HCT25, K20; BP= 134/70 & she remains asymptomatic; still needs to incr exercise! ~  10/15: on ASA81, Lisinopril20, HCT25, K20; BP= 128/80 & she feels well w/o CP, palpit, dizzy, SOB, edema, etc; still needs diet & exercise! ~  EKG 10/15 showed NSR, rate 78, low voltage & poor r progression...   VENOUS INSUFFICIENCY (ICD-459.81) -  she has mild VI & 1+edema... on low sodium diet, elevation, support hose...  HYPERCHOLESTEROLEMIA (ICD-272.0) >>  ~  FLP 6/08 on Simva20 showed TChol 193, TG 205, HDL 56, LDL 108...  ~  [place] 7/09 on Simva20 showed TChol 176, TG 161, HDL 50, LDL 94... keep same med, better diet effort. ~  FLP 10/10 on Simva20 showed TChol 178, TG 190, HDL 51, LDL 89 ~  FLP 10/11 on Simva20 showed TChol 197, TG 213, HDL 58, LDL 120 ~  FLP 10/12 on Simva20 showed TChol 186, TG 292, HDL 59, LDL 101... Needs better low fat diet & wt reduction. ~  FLP 10/13 on Simva20 showed TChol 204, TG 286, HDL 58, LDL 114... Not at goals, rec incr SIMVA40... ~  FLP 10/14 on Simva40 showed TChol 177, TG 185, HDL 66, LDL 74... Same med, better low fat diet. ~  FLP 10/15 on Simva40 showed TChol 155, TG 200, HDL 61, LDL 54... Chol ok but elev TG needs better low fat diet & wt reduction...  DIABETES MELLITUS (ICD-250.00) - on diet + METFORMIN 500mg Bid,  GLIMEPIRIDE 1mg -1/2 tab Qam...  ~  labs 6/09 showed BS= 144, HgA1c= 7.4... she was on Metformin once daily- incr to Bid... ~  labs 7/09 showed BS= 138, HgA1c= 6.4.Marland KitchenMarland Kitchen rec better diet & get weight down... ~  labs 10/10 on Metform500Bid+Actos15 showed BS= 131, A1c= 6.6.Marland Kitchen. rec> same meds, better diet, get wt down! ~  labs 10/11 on Metform500Bid+Actos15 showed BS= 123, A1c= 6.6 ~  Labs 10/12 on Metform500Bid+Actos15 showed BS=157, A1c=6.8; Actos is too $$ & we will switch to GLIMEP1mg - 1/2 Qam. ~  Labs 10/13 on Metform500Bid+Glim0.5 showed BS=138, A1c=6.2 ~  Labs 10/14 on  Metform500Bid+Glim1mg -1/2tab showed BS=146, A1c=6.1 ~  Labs 10/15 on Metform500Bid showed BS=121, A1c=6.1  MORBID OBESITY (ICD-278.01) - we reviewed weight reducing diet & exercise program... ~  8/09: weight = 237# which is down 6# since 2008... discussed diet + exercise... ~  10/10: weight = 248#... reviewed diet + exercise perscription for wt reduction... ~  10/11:  weight = 248# ~  10/12:  Weight = 244# ~  10/13:  Weight = 225# ~  10/14:  Weight = 217# ~  10/15:  Weight = 221#  COLONIC POLYPS (ICD-211.3) - pt finally had routine screening colonoscopy 11/09 w/ 3 adenomatous polyps removed by DrDBrodie... f/u planned 31yrs... ~  10/12:  DrDBrodie suggested f/u colon in 3 yrs, we will send flag to her to review chart & set this up... ~  Colonoscopy done 12/13 w/ one sm sessile polyp in sigmoid, Bx=hyperplastic, she said f/u 2yrs  ORTHO >> she had right wrist CTS surg by DrGramig 2013 ~  10/15: she reports trigger finger & may need surg by DrGramig...  BACK PAIN, LUMBAR (ICD-724.2) - s/p lumbar lam in 1977... ~  BMD 10/12 here showed TScores +2.6 in Spine, and -1.4 in left FemNeck; Rec to take calcium, MVI, VitD & wt bearing exercise...  HEADACHE (ICD-784.0) - hx dizziness secondary to hyperventilation in past w/ eval from Neurology...  ANXIETY DISORDER (ICD-300.00) - on ALPRAZOLAM 0.5mg  Prn...  HEALTH MAINTENANCE:  ~  GI:  ?due for f/u colon w/ DrBrodie> we will send inquiry... ~  GYN:  She doesn't currently have a GYN, ?who she saw prev?, encouraged to pick & call for PAP, Mammogram (done [facility] 10/13- neg), BMD (done 10/12 here w/ Tscore -1.4 in left FemNeck; on Ca, MVI, VitD, wt bearing exer)... ~  Immuniz:  She receives  the seasonal Flu vaccine each fall...   Past Surgical History:  Procedure Laterality Date  . CARPAL TUNNEL RELEASE    . COLONOSCOPY    . DILATION AND CURETTAGE OF UTERUS    . LUMBAR LAMINECTOMY  1997   Dr. Quentin Cornwall  . PELVIC ABCESS DRAINAGE  1970   Dr. Marjory Sneddon  . POLYPECTOMY      Outpatient Encounter Prescriptions as of 06/07/16  Medication Sig  . aspirin 81 MG tablet Take 81 mg by mouth daily.    . Calcium Carbonate Antacid (TUMS E-X PO) Take by mouth as needed.  . cholecalciferol (VITAMIN D) 1000 UNITS tablet Take 1,000 Units by mouth daily.  . famotidine (PEPCID) 10 MG tablet Take 10 mg by mouth as needed.  . ferrous sulfate 325 (65 FE) MG tablet Take 325 mg by mouth daily with breakfast.    . hydrochlorothiazide (HYDRODIURIL) 25 MG tablet TAKE 1/2  TABLET BY MOUTH DAILY.  Marland Kitchen lisinopril (PRINIVIL,ZESTRIL) 20 MG tablet Take 1 tablet (20 mg total) by mouth daily.  . metFORMIN (GLUCOPHAGE) 500 MG tablet TAKE 1 TABLET BY MOUTH TWICE DAILY WITH A MEAL  . Multiple Vitamins-Minerals (MULTIPLE VITAMINS/WOMENS PO) Take 1 tablet by mouth daily.    . naproxen sodium (ANAPROX) 220 MG tablet Take 220 mg by mouth 2 (two) times daily with a meal.  . potassium chloride SA (K-DUR,KLOR-CON) 20 MEQ tablet Take 1 tablet (20 mEq total) by mouth daily.  . simvastatin (ZOCOR) 40 MG tablet Take 1 tablet (40 mg total) by mouth at bedtime.  . vitamin B-12 (CYANOCOBALAMIN) 1000 MCG tablet Take 1,000 mcg by mouth daily.  Marland Kitchen glimepiride (AMARYL) 1 MG tablet Take 1/2 tablet daily      (Pt STOPPED this med in 2015)   No facility-administered encounter medications on file as of 06/01/2015.    No Known Allergies   Current Medications, Allergies, Past Medical History, Past Surgical History, Family History, and Social History were reviewed in [employer] record.     Review of Systems        The patient complains of fatigue, dyspnea on exertion, stiffness, and arthritis.  The patient denies fever, chills, sweats, anorexia, weakness, malaise, weight loss, sleep disorder, blurring, diplopia, eye irritation, eye discharge, vision loss, eye pain, photophobia, earache, ear discharge, tinnitus, decreased hearing, nasal congestion, nosebleeds, sore  throat, hoarseness, chest pain, palpitations, syncope, orthopnea, PND, peripheral edema, cough, dyspnea at rest, excessive sputum, hemoptysis, wheezing, pleurisy, nausea, vomiting, diarrhea, constipation, change in bowel habits, abdominal pain, melena, hematochezia, jaundice, gas/bloating, indigestion/heartburn, dysphagia, odynophagia, dysuria, hematuria, urinary frequency, urinary hesitancy, nocturia, incontinence, back pain, joint pain, joint swelling, muscle cramps, muscle weakness, sciatica, restless legs, leg pain at night, leg pain with exertion, rash, itching, dryness, suspicious lesions, paralysis, paresthesias, seizures, tremors, vertigo, transient blindness, frequent falls, frequent headaches, difficulty walking, depression, anxiety, memory loss, confusion, cold intolerance, heat intolerance, polydipsia, polyphagia, polyuria, unusual weight change, abnormal bruising, bleeding, enlarged lymph nodes, urticaria, allergic rash, hay fever, and recurrent infections.     Objective:   Physical Exam     WD, Obese, 70 y/o WF in NAD... GENERAL:  Alert & oriented; pleasant & cooperative... HEENT:  [place]/AT, EOM-wnl, PERRLA, EACs-clear, TMs-wnl, NOSE-clear, THROAT-clear & wnl. NECK:  Supple w/ fairROM; no JVD; normal carotid impulses w/o bruits; no thyromegaly or nodules palpated; no lymphadenopathy. CHEST:  Clear to P & A; without wheezes/ rales/ or rhonchi. HEART:  Regular Rhythm; without murmurs/ rubs/ or gallops. ABDOMEN:  Obese, soft &  nontender w/ large panniculus; normal bowel sounds; no organomegaly or masses detected. EXT: without deformities, mild arthritic changes; no varicose veins/ +venous insuffic/ tr edema... NEURO:  CN's intact;  no focal neuro deficits... DERM:  no rash, no lesions noted...  RADIOLOGY DATA:  Reviewed in the EPIC EMR & discussed w/ the patient...  LABORATORY DATA:  Reviewed in the EPIC EMR & discussed w/ the patient...   Assessment & Plan:    HBP>  Controlled on  her meds but needs better diet & wt reduction...  Ven Insuffic>  On sodium restriction, elevation, support hose;  She has HCT as well helping the edema...  CHOL>  On Simva40 now & FLP is off; TG still elev & we reviewed low fat diet...  DM>  Her A1c remains in the 6's;  She will watch BS at home in light of her A1c=6.4 & current meds...  Obesity>  BMI in the 40's; she must diet, exercise, get the wt down...  Hx colon polyps>  F/u colon done 12/13 w/ one sm hyperplastic polyp removed; DrBrodie said f/u 10 yrs...  Hx LBP>  She had LLam in 1977; uses OTC analgesics as needed...  Anxiety>  She has Alpraz for prn use...   Patient's Medications  New Prescriptions   No medications on file  Previous Medications   ASPIRIN EC 81 MG TABLET    Take 81 mg by mouth at bedtime.   CALCIUM CARBONATE ANTACID (TUMS E-X PO)    Take by mouth as needed.   CHOLECALCIFEROL (VITAMIN D) 1000 UNITS TABLET    Take 2,000 Units by mouth daily.    FAMOTIDINE (PEPCID) 10 MG TABLET    Take 10 mg by mouth as needed.   FERROUS SULFATE 325 (65 FE) MG TABLET    Take 325 mg by mouth daily with breakfast.     MULTIPLE VITAMINS-MINERALS (MULTIPLE VITAMINS/WOMENS PO)    Take 1 tablet by mouth daily.     NAPROXEN SODIUM (ANAPROX) 220 MG TABLET    Take 440 mg by mouth daily as needed (back pain).    VITAMIN B-12 (CYANOCOBALAMIN) 1000 MCG TABLET    Take 1,000 mcg by mouth daily.  Modified Medications   Modified Medication Previous Medication   HYDROCHLOROTHIAZIDE (HYDRODIURIL) 25 MG TABLET hydrochlorothiazide (HYDRODIURIL) 25 MG tablet      Take 0.5 tablets (12.5 mg total) by mouth daily.    TAKE 1/2 TABLET BY MOUTH DAILY   LISINOPRIL (PRINIVIL,ZESTRIL) 20 MG TABLET lisinopril (PRINIVIL,ZESTRIL) 20 MG tablet      TAKE 1 TABLET(20 MG) BY MOUTH DAILY    TAKE 1 TABLET(20 MG) BY MOUTH DAILY   METFORMIN (GLUCOPHAGE) 500 MG TABLET metFORMIN (GLUCOPHAGE) 500 MG tablet      TAKE 1 TABLET BY MOUTH TWICE DAILY WITH A MEAL    TAKE 1  TABLET BY MOUTH TWICE DAILY WITH A MEAL   POTASSIUM CHLORIDE SA (K-DUR,KLOR-CON) 20 MEQ TABLET potassium chloride SA (K-DUR,KLOR-CON) 20 MEQ tablet      TAKE 1 TABLET(20 MEQ) BY MOUTH DAILY    TAKE 1 TABLET(20 MEQ) BY MOUTH DAILY   SIMVASTATIN (ZOCOR) 40 MG TABLET simvastatin (ZOCOR) 40 MG tablet      Take 1 tablet (40 mg total) by mouth at bedtime.    Take 1 tablet (40 mg total) by mouth at bedtime.  Discontinued Medications   LISINOPRIL (PRINIVIL,ZESTRIL) 20 MG TABLET    TAKE 1 TABLET(20 MG) BY MOUTH DAILY   POTASSIUM CHLORIDE SA (K-DUR,KLOR-CON) 20 MEQ TABLET  TAKE 1 TABLET(20 MEQ) BY MOUTH DAILY

## 2016-06-07 NOTE — Patient Instructions (Signed)
Today we updated your med list in our EPIC system...    Continue your current medications the same...    We refilled your meds as requested...  We reviewed your recent FASTING blood work & gave you a copy for your records...    We discussed the need for a low carb/ low fat/ weight reducing diet...    This is to help your DM, Lipids and the fatty liver disease as we discussed...  Today we did a 57yr f/u CXR...    We will contact you w/ the results when available...   We ill arrange for your mammogram and bone density tests at [facility]...  Call for any questions...  Let's plan a follow up visit in 47months for recheck labs 7 to monitor your progress.Cynthia [name]

## 2016-06-10 ENCOUNTER — Telehealth: Payer: Self-pay | Admitting: Pulmonary Disease

## 2016-06-10 NOTE — Progress Notes (Signed)
Pt aware per 11.10.17 phone note

## 2016-06-10 NOTE — Telephone Encounter (Signed)
Called spoke with patient, advised of cxr results as per below.  Pt voiced her understanding Nothing further needed; will sign off  Result Notes  Notes Recorded by Elie Confer, CMA on 06/09/2016 at 5:22 PM EST [id number] x 1 ------  Notes Recorded by Noralee Space, MD on 06/08/2016 at 2:03 PM EST Please notify patient>   CXR looks OK- no acute abnormalities, no change from prev films.Cynthia KitchenMarland [name]

## 2016-06-17 ENCOUNTER — Encounter: Payer: Self-pay | Admitting: Pulmonary Disease

## 2016-08-10 ENCOUNTER — Other Ambulatory Visit: Payer: Self-pay | Admitting: Pulmonary Disease

## 2016-11-05 ENCOUNTER — Other Ambulatory Visit: Payer: Self-pay | Admitting: Pulmonary Disease

## 2016-12-05 ENCOUNTER — Ambulatory Visit: Payer: Medicare Other | Admitting: Pulmonary Disease

## 2017-02-04 ENCOUNTER — Other Ambulatory Visit: Payer: Self-pay | Admitting: Pulmonary Disease

## 2017-05-26 ENCOUNTER — Other Ambulatory Visit: Payer: Self-pay | Admitting: Pulmonary Disease

## 2017-07-30 ENCOUNTER — Other Ambulatory Visit: Payer: Self-pay | Admitting: Pulmonary Disease

## 2018-01-29 IMAGING — DX DG CHEST 2V
2 series · 2 of 2 positions shown · non-contrast
Comparison: PA and lateral chest x-ray May 30, 2014

CLINICAL DATA: Nonproductive cough intermittently for the past 5
years. Patient is a former smoker. There is history of morbid
obesity, diabetes, and hypertension.

EXAM:
CHEST  2 VIEW

[chest pa]
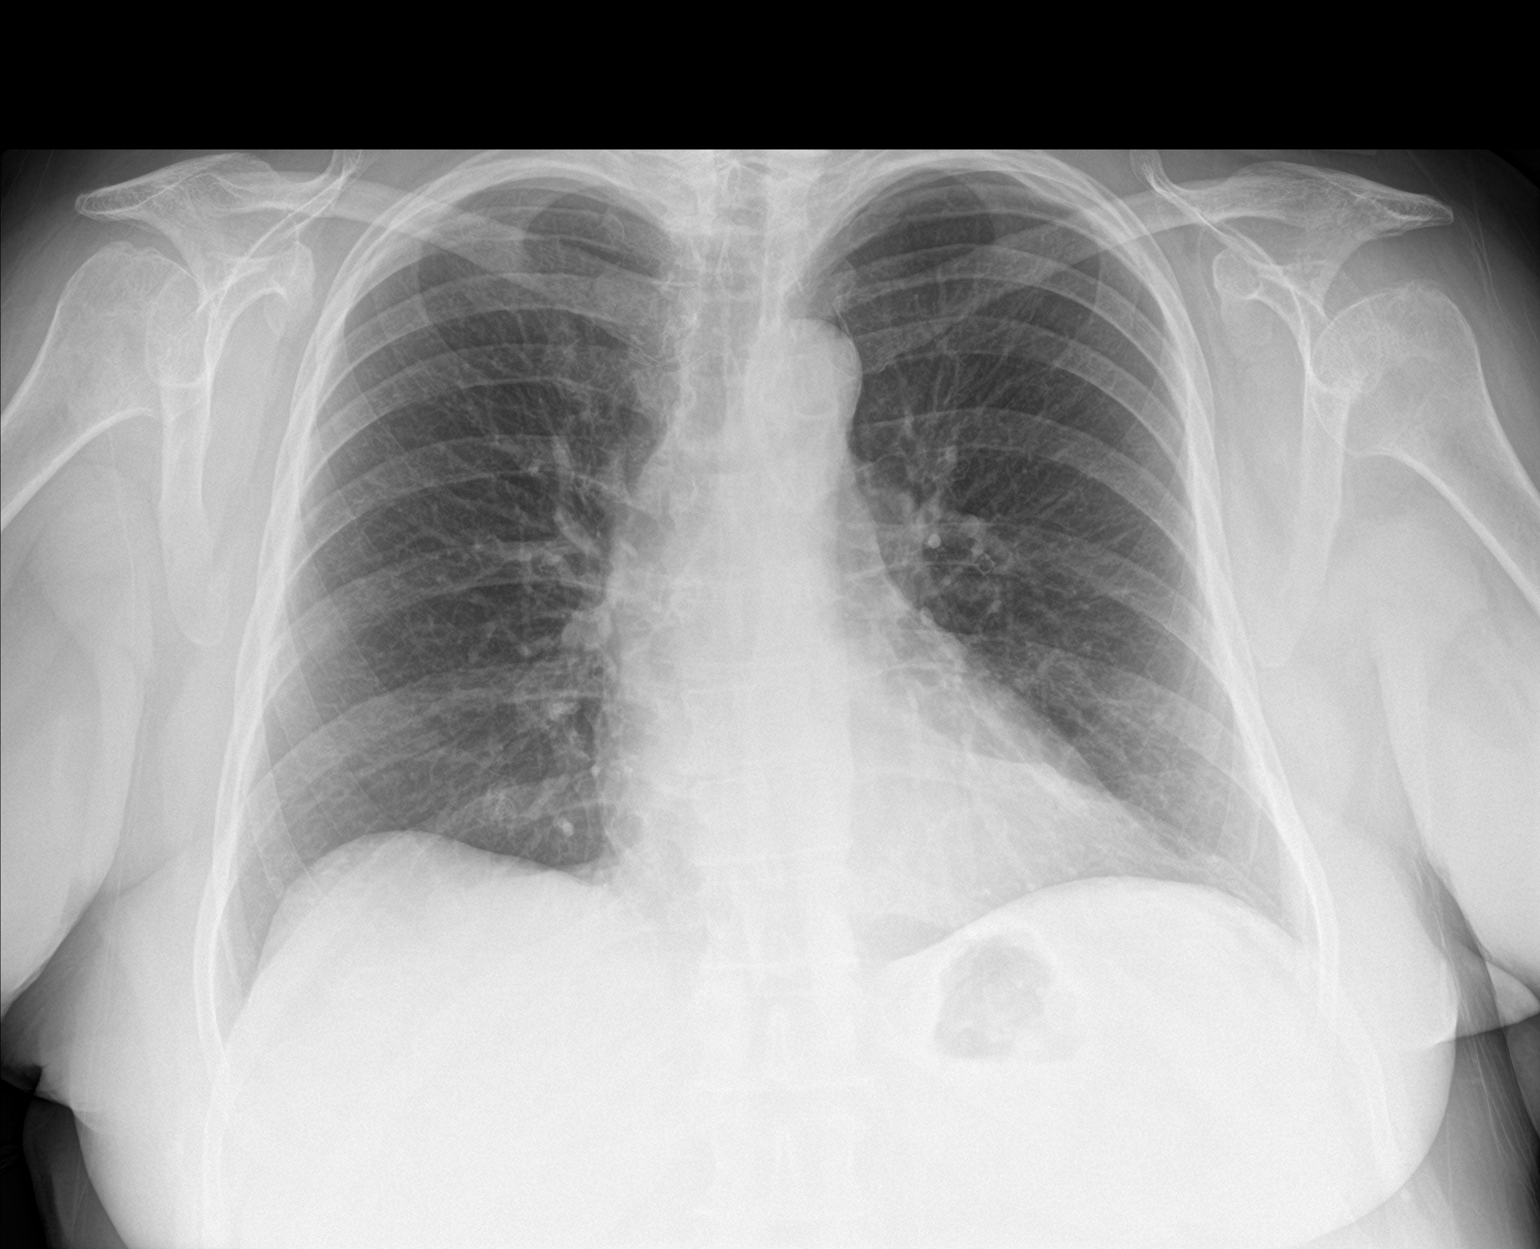

[chest lat]
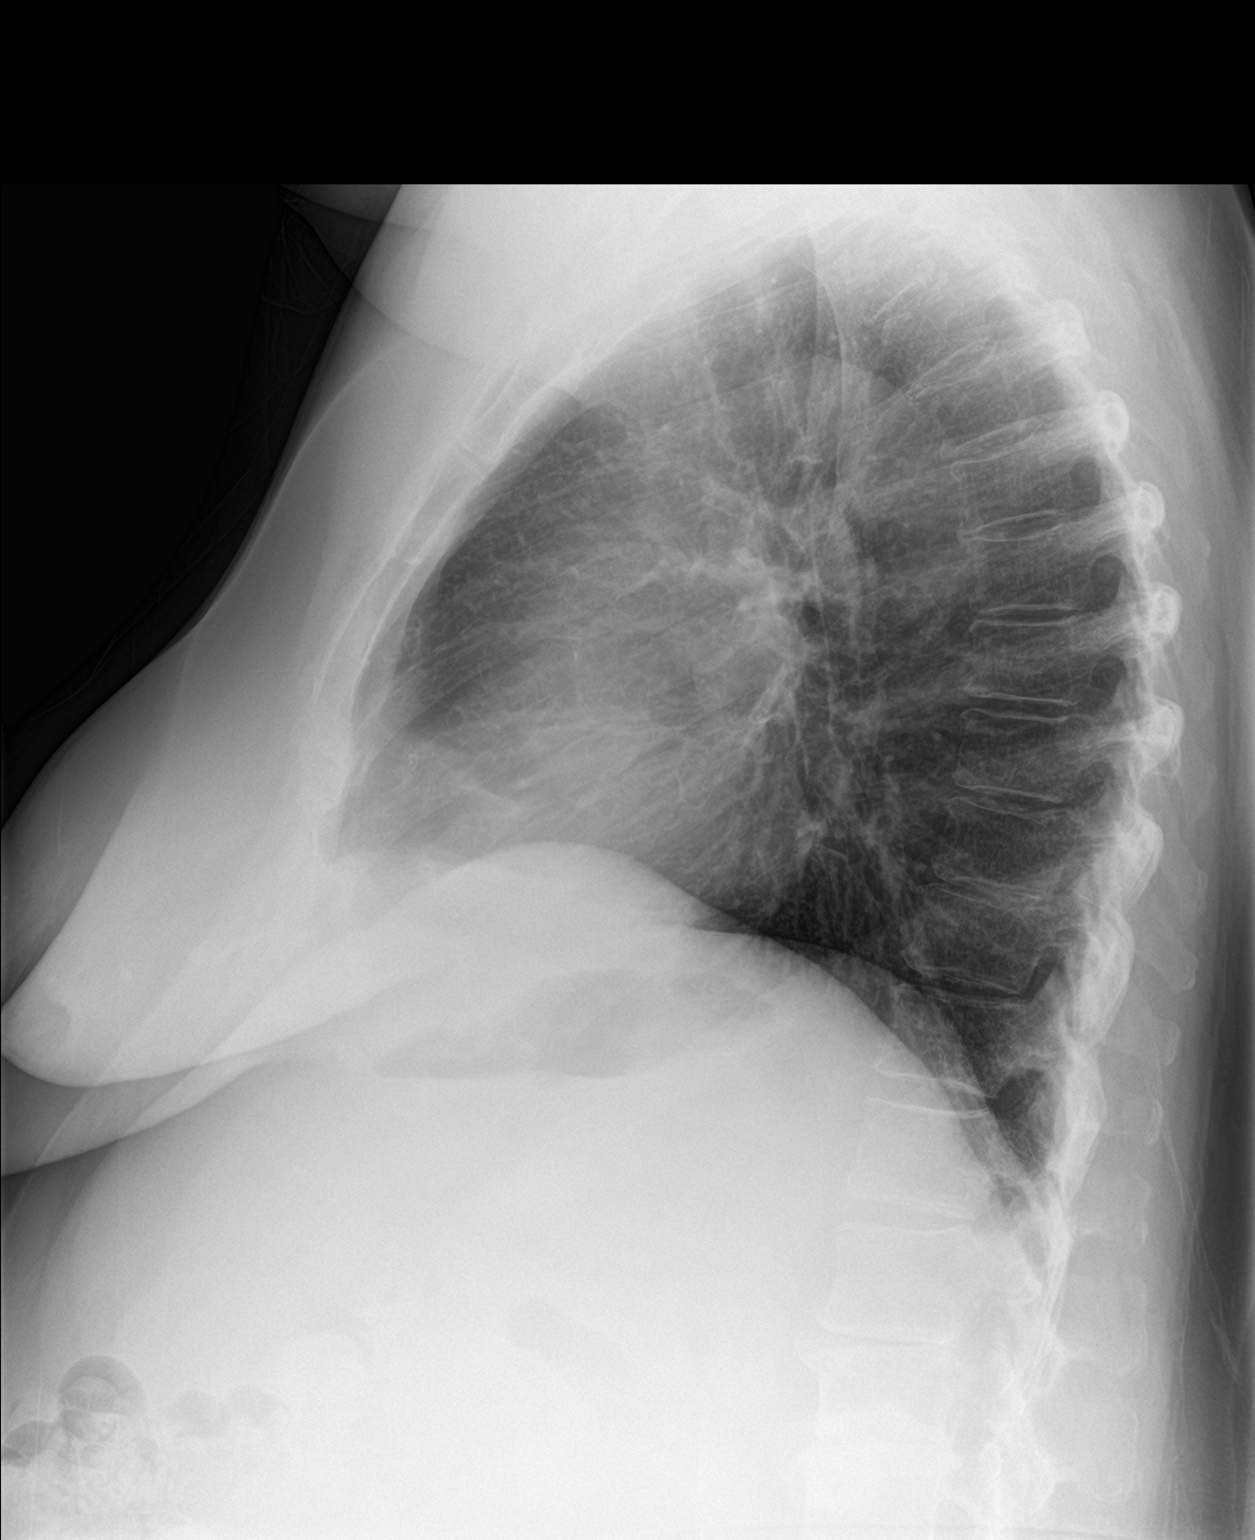

[2 of 2 positions shown; findings below may reference images not displayed]

FINDINGS: The lungs are well-expanded. There is no focal infiltrate. There is
no pleural effusion. The heart and pulmonary vascularity are normal.
The mediastinum is normal in width. There is calcification in the
wall of the aortic arch. The bony thorax exhibits no acute
abnormality.
IMPRESSION: There is no active cardiopulmonary disease.

Aortic atherosclerosis.

## 2019-11-25 ENCOUNTER — Telehealth: Payer: Self-pay | Admitting: Cardiology

## 2019-11-25 NOTE — Telephone Encounter (Signed)
Cynthia [name] is calling requesting her brother attend her upcoming appt scheduled for 11/26/19 due to him driving her. She states he doesn't have to come into the room with her she just wanted him to be able to wait for her in the waiting room instead of the car. Please advise.

## 2019-11-25 NOTE — Telephone Encounter (Signed)
Pt is inquiring if her Brother Shanon Brow can accompany her to her appt with Dr. Meda Coffee on 4/27, for he has to drive her and help her ambulate.  Informed the pt that her Brother Shanon Brow may attend the appt with her on 4/27, but they should both wear their facial mask to the appt and during the entire duration of the appt.  Pt verbalized understanding and agrees with this plan. Updated this in appt notes.

## 2019-11-26 ENCOUNTER — Encounter: Payer: Self-pay | Admitting: Cardiology

## 2019-11-26 ENCOUNTER — Ambulatory Visit: Payer: Medicare Other | Admitting: Cardiology

## 2019-11-26 ENCOUNTER — Other Ambulatory Visit: Payer: Self-pay

## 2019-11-26 VITALS — BP 138/78 | HR 103 | Ht 62.0 in | Wt 213.0 lb

## 2019-11-26 DIAGNOSIS — I10 Essential (primary) hypertension: Secondary | ICD-10-CM | POA: Diagnosis not present

## 2019-11-26 DIAGNOSIS — Z8249 Family history of ischemic heart disease and other diseases of the circulatory system: Secondary | ICD-10-CM

## 2019-11-26 DIAGNOSIS — E785 Hyperlipidemia, unspecified: Secondary | ICD-10-CM | POA: Diagnosis not present

## 2019-11-26 DIAGNOSIS — R55 Syncope and collapse: Secondary | ICD-10-CM

## 2019-11-26 MED ORDER — FISH OIL 1000 MG PO CAPS: 2000.0000 mg | ORAL_CAPSULE | Freq: Every day | ORAL | 6 refills | Status: DC

## 2019-11-26 NOTE — Patient Instructions (Signed)
Medication Instructions:   START TAKING OTC FISH OIL 2,000 MG BY MOUTH DAILY  *If you need a refill on your cardiac medications before your next appointment, please call your pharmacy*   Testing/Procedures:  Your physician has requested that you have an echocardiogram. Echocardiography is a painless test that uses sound waves to create images of your heart. It provides your doctor with information about the size and shape of your heart and how well your heart's chambers and valves are working. This procedure takes approximately one hour. There are no restrictions for this procedure.   Follow-Up: At [facility], you and your health needs are our priority.  As part of our continuing mission to provide you with exceptional heart care, we have created designated Provider Care Teams.  These Care Teams include your primary Cardiologist (physician) and Advanced Practice Providers (APPs -  Physician Assistants and Nurse Practitioners) who all work together to provide you with the care you need, when you need it.  We recommend signing up for the patient portal called "MyChart".  Sign up information is provided on this After Visit Summary.  MyChart is used to connect with patients for Virtual Visits (Telemedicine).  Patients are able to view lab/test results, encounter notes, upcoming appointments, etc.  Non-urgent messages can be sent to your provider as well.   To learn more about what you can do with MyChart, go to [email or web address].    Your next appointment:   12 month(s)  The format for your next appointment:   In Person  Provider:   Ena Dawley, MD

## 2019-11-26 NOTE — Progress Notes (Signed)
Cardiology Office Note:    Date:  11/26/2019   ID:  Cynthia [name], DOB Oct 21, 1945, MRN [id number]  PCP:  Velna Hatchet, MD  Cardiologist:  No primary care provider on file.  Electrophysiologist:  None   Referring MD: Velna Hatchet, MD   Reason for visit: Syncope  History of Present Illness:    Cynthia [name] is a 74 y.o. female voice new patient to our clinic, she has no prior cardiac history.  She has a history of type 2 diabetes, hyperlipidemia, hypertension, balance issues for which she walks with a cane.  She was referred to [place] from her primary care office after an episode of syncope.  This is first episode of syncope she has ever had.  The patient states that she woke up at night to walk to the bathroom and next thing she remembers is getting up from the floor after an episode of syncope when she hit her forehead and her eye.  She followed by her primary care physician.  She denies any chest pain at the time of palpitations.  She has been feeling okay since then. She has significant family history of premature coronary artery disease, her brother had MI and bypass surgery in his early 69s, he still alive and being treated for CHF.  Her mother had a myocardial infarction at age 24 and her father had bypass surgery in her early 63s and eventually passed away after his second bypass surgery at age of 49. She herself is fairly active in has not been experiencing any symptoms of chest pain or shortness of breath.  Past Medical History:  Diagnosis Date  . Anxiety disorder   . Asthmatic bronchitis   . Colonic polyp   . Degenerative joint disease   . Diabetes mellitus   . Headache(784.0)   . Hypercholesteremia   . Hypertension   . Lumbar back pain   . Morbid obesity ([place])   . Venous insufficiency     Past Surgical History:  Procedure Laterality Date  . CARPAL TUNNEL RELEASE    . COLONOSCOPY    . DILATION AND CURETTAGE OF UTERUS    . LUMBAR LAMINECTOMY  1997   Dr. Quentin Cornwall  .  PELVIC ABCESS DRAINAGE  1970   Dr. Marjory Sneddon  . POLYPECTOMY      Current Medications: Current Meds  Medication Sig  . amLODipine (NORVASC) 5 MG tablet Take 5 mg by mouth daily.  Marland Kitchen aspirin EC 81 MG tablet Take 81 mg by mouth at bedtime.  . Calcium Carbonate Antacid (TUMS E-X PO) Take by mouth as needed.  . cholecalciferol (VITAMIN D) 1000 UNITS tablet Take 2,000 Units by mouth daily.   . famotidine (PEPCID) 10 MG tablet Take 10 mg by mouth as needed.  . ferrous sulfate 325 (65 FE) MG tablet Take 325 mg by mouth daily with breakfast.    . hydrochlorothiazide (HYDRODIURIL) 25 MG tablet Take 0.5 tablets (12.5 mg total) by mouth daily.  Marland Kitchen lisinopril (ZESTRIL) 40 MG tablet Take 40 mg by mouth daily.  . metFORMIN (GLUCOPHAGE) 500 MG tablet TAKE 1 TABLET BY MOUTH TWICE DAILY WITH A MEAL  . Multiple Vitamins-Minerals (MULTIPLE VITAMINS/WOMENS PO) Take 1 tablet by mouth daily.    . naproxen sodium (ANAPROX) 220 MG tablet Take 440 mg by mouth daily as needed (back pain).   . potassium chloride SA (K-DUR,KLOR-CON) 20 MEQ tablet TAKE 1 TABLET(20 MEQ) BY MOUTH DAILY  . simvastatin (ZOCOR) 40 MG tablet Take 1 tablet (40  mg total) by mouth at bedtime.  . vitamin B-12 (CYANOCOBALAMIN) 1000 MCG tablet Take 1,000 mcg by mouth daily.     Allergies:   Patient has no known allergies.   Social History   Socioeconomic History  . Marital status: Divorced    Spouse name: Not on file  . Number of children: Not on file  . Years of education: Not on file  . Highest education level: Not on file  Occupational History  . Occupation: retired  Tobacco Use  . Smoking status: Former Smoker    Packs/day: 0.40    Years: 6.00    Pack years: 2.40    Types: Cigarettes    Quit date: 08/01/1978    Years since quitting: 41.3  . Smokeless tobacco: Never Used  Substance and Sexual Activity  . Alcohol use: No    Alcohol/week: 0.0 standard drinks  . Drug use: No  . Sexual activity: Not on file  Other Topics Concern   . Not on file  Social History Narrative   Lives alone in an apartment on the second floor.  Has no children.  Retired Sales promotion account executive.  Education: high school.   Social Determinants of Health   Financial Resource Strain:   . Difficulty of Paying Living Expenses:   Food Insecurity:   . Worried About Charity fundraiser in the Last Year:   . Arboriculturist in the Last Year:   Transportation Needs:   . Film/video editor (Medical):   Marland Kitchen Lack of Transportation (Non-Medical):   Physical Activity:   . Days of Exercise per Week:   . Minutes of Exercise per Session:   Stress:   . Feeling of Stress :   Social Connections:   . Frequency of Communication with Friends and Family:   . Frequency of Social Gatherings with Friends and Family:   . Attends Religious Services:   . Active Member of Clubs or Organizations:   . Attends Archivist Meetings:   Marland Kitchen Marital Status:      Family History: The patient's family history includes Diabetes in her brother, brother, maternal grandmother, mother, paternal grandmother, and unknown relative; Heart disease in her father and mother.  ROS:   Please see the history of present illness.    All other systems reviewed and are negative.  EKGs/Labs/Other Studies Reviewed:    The following studies were reviewed today:  EKG:  EKG is ordered today.  The ekg ordered today demonstrates normal sinus rhythm, low voltage EKG poor R wave progression in precordial leads, this was personally reviewed.  Recent Labs: No results found for requested labs within last 8760 hours.  Recent Lipid Panel    Component Value Date/Time   CHOL 170 06/01/2016 0944   TRIG 236.0 (H) 06/01/2016 0944   HDL 62.50 06/01/2016 0944   CHOLHDL 3 06/01/2016 0944   VLDL 47.2 (H) 06/01/2016 0944   LDLCALC 54 05/22/2014 0937   LDLDIRECT 88.0 06/01/2016 0944   Physical Exam:    VS:  BP 138/78   Pulse (!) 103   Ht 5\' 2"  (1.575 m)   Wt 213 lb (96.6 kg)   SpO2 98%    BMI 38.96 kg/m     Wt Readings from Last 3 Encounters:  11/26/19 213 lb (96.6 kg)  06/07/16 222 lb 8 oz (100.9 kg)  08/10/15 220 lb (99.8 kg)    GEN:  Well nourished, well developed in no acute distress HEENT: Normal NECK: No JVD; No carotid  bruits LYMPHATICS: No lymphadenopathy CARDIAC: RRR, no murmurs, rubs, gallops RESPIRATORY:  Clear to auscultation without rales, wheezing or rhonchi  ABDOMEN: Soft, non-tender, non-distended MUSCULOSKELETAL:  No edema; No deformity  SKIN: Warm and dry NEUROLOGIC:  Alert and oriented x 3 PSYCHIATRIC:  Normal affect   ASSESSMENT:    1. Syncope, unspecified syncope type   2. Essential hypertension   3. Hyperlipidemia, unspecified hyperlipidemia type   4. Family history of early CAD    PLAN:    In order of problems listed above:  1. Syncope -isolated episode, appears to be vasovagal.  However she has never had an echocardiogram and has significant family history of premature coronary artery disease and CHF, her EKG has low voltage, will obtain an echocardiogram.  If normal we will just follow her in a year.  She has no prior history of palpitations dizziness or syncope in the past. 2. Hypertension -well controlled. 3. Hyperlipidemia, with, LDL 61 and HDL 56, normal LFTs.  We will continue simvastatin, and add fish oil 2 g a day. 4. Family history of premature coronary artery disease -the patient has significant family history however she is already on aspirin statin and if we are adding fish oil, she is asymptomatic so no ischemic work-up is necessary at this point but I would like to follow her once a year for primary prevention.   Medication Adjustments/Labs and Tests Ordered: Current medicines are reviewed at length with the patient today.  Concerns regarding medicines are outlined above.  Orders Placed This Encounter  Procedures  . EKG 12-Lead  . ECHOCARDIOGRAM COMPLETE   Meds ordered this encounter  Medications  . Omega-3 Fatty  Acids (FISH OIL) 1000 MG CAPS    Sig: Take 2 capsules (2,000 mg total) by mouth daily.    Dispense:  60 capsule    Refill:  6    Patient Instructions  Medication Instructions:   START TAKING OTC FISH OIL 2,000 MG BY MOUTH DAILY  *If you need a refill on your cardiac medications before your next appointment, please call your pharmacy*   Testing/Procedures:  Your physician has requested that you have an echocardiogram. Echocardiography is a painless test that uses sound waves to create images of your heart. It provides your doctor with information about the size and shape of your heart and how well your heart's chambers and valves are working. This procedure takes approximately one hour. There are no restrictions for this procedure.   Follow-Up: At [facility], you and your health needs are our priority.  As part of our continuing mission to provide you with exceptional heart care, we have created designated Provider Care Teams.  These Care Teams include your primary Cardiologist (physician) and Advanced Practice Providers (APPs -  Physician Assistants and Nurse Practitioners) who all work together to provide you with the care you need, when you need it.  We recommend signing up for the patient portal called "MyChart".  Sign up information is provided on this After Visit Summary.  MyChart is used to connect with patients for Virtual Visits (Telemedicine).  Patients are able to view lab/test results, encounter notes, upcoming appointments, etc.  Non-urgent messages can be sent to your provider as well.   To learn more about what you can do with MyChart, go to [email or web address].    Your next appointment:   12 month(s)  The format for your next appointment:   In Person  Provider:   Ena Dawley, MD  Signed, Ena Dawley, MD  11/26/2019 1:52 PM    [facility]

## 2019-12-16 ENCOUNTER — Other Ambulatory Visit: Payer: Self-pay

## 2019-12-16 ENCOUNTER — Ambulatory Visit (HOSPITAL_COMMUNITY): Payer: Medicare Other | Attending: Cardiology

## 2019-12-16 DIAGNOSIS — Z8249 Family history of ischemic heart disease and other diseases of the circulatory system: Secondary | ICD-10-CM | POA: Diagnosis not present

## 2019-12-16 DIAGNOSIS — E785 Hyperlipidemia, unspecified: Secondary | ICD-10-CM | POA: Diagnosis not present

## 2019-12-16 DIAGNOSIS — R55 Syncope and collapse: Secondary | ICD-10-CM | POA: Diagnosis not present

## 2019-12-16 DIAGNOSIS — I10 Essential (primary) hypertension: Secondary | ICD-10-CM | POA: Diagnosis present

## 2019-12-16 MED ORDER — PERFLUTREN LIPID MICROSPHERE
1.0000 mL | INTRAVENOUS | Status: AC | PRN
  Administered 2019-12-16: 2 mL via INTRAVENOUS

## 2020-07-15 ENCOUNTER — Other Ambulatory Visit (HOSPITAL_COMMUNITY): Payer: Self-pay | Admitting: Internal Medicine

## 2020-07-15 DIAGNOSIS — R55 Syncope and collapse: Secondary | ICD-10-CM

## 2020-07-16 ENCOUNTER — Ambulatory Visit (HOSPITAL_COMMUNITY)
Admission: RE | Admit: 2020-07-16 | Discharge: 2020-07-16 | Disposition: A | Discharge status: Home / Self Care | Payer: Medicare Other | Source: Ambulatory Visit | Attending: Internal Medicine | Admitting: Internal Medicine

## 2020-07-16 ENCOUNTER — Other Ambulatory Visit: Payer: Self-pay

## 2020-07-16 DIAGNOSIS — R55 Syncope and collapse: Secondary | ICD-10-CM | POA: Diagnosis not present

## 2020-07-27 ENCOUNTER — Ambulatory Visit: Payer: Medicare Other | Admitting: Diagnostic Neuroimaging

## 2020-07-27 ENCOUNTER — Encounter: Payer: Self-pay | Admitting: *Deleted

## 2020-07-27 VITALS — BP 141/70 | HR 89 | Ht 61.5 in | Wt 213.4 lb

## 2020-07-27 DIAGNOSIS — R55 Syncope and collapse: Secondary | ICD-10-CM | POA: Diagnosis not present

## 2020-07-27 NOTE — Patient Instructions (Signed)
  RECURRENT UNPROVOKED SYNCOPE (March - Nov 2021) - check MRI brain, EEG for seizure work up   - caution with living alone  - According to [facility] law, you can not drive unless you are seizure / syncope free for at least 6 months and under physician's care.   - Please maintain precautions. Do not participate in activities where a loss of awareness could harm you or someone else. No swimming alone, no tub bathing, no hot tubs, no driving, no operating motorized vehicles (cars, ATVs, motocycles, etc), lawnmowers, power tools or firearms. No standing at heights, such as rooftops, ladders or stairs. Avoid hot objects such as stoves, heaters, open fires. Wear a helmet when riding a bicycle, scooter, skateboard, etc. and avoid areas of traffic. Set your water heater to 120 degrees or less.

## 2020-07-27 NOTE — Progress Notes (Signed)
[facility]  PATIENT: Cynthia [name] DOB: 04-11-1946  REFERRING CLINICIAN: Alysia Penna, MD HISTORY FROM: patient  REASON FOR VISIT: new consult    HISTORICAL  CHIEF COMPLAINT:  Chief Complaint  Patient presents with   Recurrent syncope episodes    Rm 6 New Pt, brother- Onalee Hua  "I wake up on the floor, time of day varies, no other symptoms with these episodes; 2 worst ones happened in the bathroom, total of 4 since June 2021"    HISTORY OF PRESENT ILLNESS:   74 year old female here for evaluation of recurrent syncope.  Since March 2021 she has had 4 episodes of unprovoked loss of consciousness.  Patient can be standing up, and then all of a sudden she wakes up several hours later on the floor.  No prodromal lightheadedness or dizziness.  No tongue biting or incontinence.  Afterward she feels weak.  No warning for these events.  No particular pattern.  She has had a total of 4 episodes over past 9 months.  Patient lives alone.  She has chronic back pain issues.  She has chronic left leg weakness from her back issues.  She uses a cane.  She has some help from family members who live 20 to 30 minutes away.   REVIEW OF SYSTEMS: Full 14 system review of systems performed and negative with exception of: As per HPI.  ALLERGIES: No Known Allergies  HOME MEDICATIONS: Outpatient Medications Prior to Visit  Medication Sig Dispense Refill   amLODipine (NORVASC) 5 MG tablet Take 5 mg by mouth daily.     aspirin EC 81 MG tablet Take 81 mg by mouth at bedtime.     Calcium Carbonate Antacid (TUMS E-X PO) Take by mouth as needed.     cholecalciferol (VITAMIN D) 1000 UNITS tablet Take 2,000 Units by mouth daily.      famotidine (PEPCID) 10 MG tablet Take 10 mg by mouth as needed.     ferrous sulfate 325 (65 FE) MG tablet Take 325 mg by mouth daily with breakfast.     hydrochlorothiazide (HYDRODIURIL) 25 MG tablet Take 0.5 tablets (12.5 mg total) by mouth daily. 45  tablet 3   lisinopril (ZESTRIL) 40 MG tablet Take 40 mg by mouth daily.     metFORMIN (GLUCOPHAGE) 500 MG tablet TAKE 1 TABLET BY MOUTH TWICE DAILY WITH A MEAL 180 tablet 0   Multiple Vitamins-Minerals (MULTIPLE VITAMINS/WOMENS PO) Take 1 tablet by mouth daily.     naproxen sodium (ANAPROX) 220 MG tablet Take 440 mg by mouth daily as needed (back pain).      Omega-3 Fatty Acids (FISH OIL) 1000 MG CAPS Take 2 capsules (2,000 mg total) by mouth daily. 60 capsule 6   potassium chloride SA (K-DUR,KLOR-CON) 20 MEQ tablet TAKE 1 TABLET(20 MEQ) BY MOUTH DAILY 90 tablet 0   simvastatin (ZOCOR) 40 MG tablet Take 1 tablet (40 mg total) by mouth at bedtime. 90 tablet 3   vitamin B-12 (CYANOCOBALAMIN) 1000 MCG tablet Take 1,000 mcg by mouth daily.     No facility-administered medications prior to visit.    PAST MEDICAL HISTORY: Past Medical History:  Diagnosis Date   Anxiety disorder    Asthmatic bronchitis    Colonic polyp    Degenerative joint disease    Diabetes mellitus    Headache(784.0)    Hypercholesteremia    Hypertension    Lumbar back pain    Morbid obesity ([facility])    Syncope    recurrent  Venous insufficiency     PAST SURGICAL HISTORY: Past Surgical History:  Procedure Laterality Date   CARPAL TUNNEL RELEASE     CATARACT EXTRACTION  2016   COLONOSCOPY     DILATION AND CURETTAGE OF UTERUS     LUMBAR LAMINECTOMY  1997   Dr. Quentin Cornwall   PELVIC ABCESS DRAINAGE  1970   Dr. Marjory Sneddon   POLYPECTOMY      FAMILY HISTORY: Family History  Problem Relation Age of Onset   Heart disease Mother    Diabetes Mother    Heart attack Mother        deceased 28   Heart disease Father    Heart attack Father        prior to 22   Diabetes Brother    Diabetes Maternal Grandmother    Diabetes Paternal Grandmother    Diabetes Brother    Stroke Brother    Hypertension Brother    Heart failure Brother    Diabetes Other        sibling    SOCIAL  HISTORY: Social History   Socioeconomic History   Marital status: Divorced    Spouse name: Not on file   Number of children: 0   Years of education: 12   Highest education level: Not on file  Occupational History   Occupation: retired  Tobacco Use   Smoking status: Former Smoker    Packs/day: 0.40    Years: 6.00    Pack years: 2.40    Types: Cigarettes    Quit date: 08/01/1978    Years since quitting: 42.0   Smokeless tobacco: Never Used  Substance and Sexual Activity   Alcohol use: No    Alcohol/week: 0.0 standard drinks   Drug use: No   Sexual activity: Not on file  Other Topics Concern   Not on file  Social History Narrative   Lives alone in an apartment on the second floor.  Has no children.  Retired Sales promotion account executive.  Education: high school.   Social Determinants of Health   Financial Resource Strain: Not on file  Food Insecurity: Not on file  Transportation Needs: Not on file  Physical Activity: Not on file  Stress: Not on file  Social Connections: Not on file  Intimate Partner Violence: Not on file     PHYSICAL EXAM  GENERAL EXAM/CONSTITUTIONAL: Vitals:  Vitals:   07/27/20 1355  BP: (!) 141/70  Pulse: 89  Weight: 213 lb 6.4 oz (96.8 kg)  Height: 5' 1.5" (1.562 m)     Body mass index is 39.67 kg/m. Wt Readings from Last 3 Encounters:  07/27/20 213 lb 6.4 oz (96.8 kg)  11/26/19 213 lb (96.6 kg)  06/07/16 222 lb 8 oz (100.9 kg)     Patient is in no distress; well developed, nourished and groomed; neck is supple  CARDIOVASCULAR:  Examination of carotid arteries is normal; no carotid bruits  Regular rate and rhythm, no murmurs  Examination of peripheral vascular system by observation and palpation is normal  EYES:  Ophthalmoscopic exam of optic discs and posterior segments is normal; no papilledema or hemorrhages  No exam data present  MUSCULOSKELETAL:  Gait, strength, tone, movements noted in Neurologic exam  below  NEUROLOGIC: MENTAL STATUS:  No flowsheet data found.  awake, alert, oriented to person, place and time  recent and remote memory intact  normal attention and concentration  language fluent, comprehension intact, naming intact  fund of knowledge appropriate  CRANIAL NERVE:   2nd -  no papilledema on fundoscopic exam  2nd, 3rd, 4th, 6th - pupils --> RIGHT 3MM NO REACTION, LEFT 5MM NO REACTION; visual fields full to confrontation, extraocular muscles intact, no nystagmus  5th - facial sensation symmetric  7th - facial strength symmetric  8th - hearing intact  9th - palate elevates symmetrically, uvula midline  11th - shoulder shrug symmetric  12th - tongue protrusion midline  MOTOR:   normal bulk and tone, full strength in the BUE, RLE; LLE 3-4  SENSORY:   normal and symmetric to light touch, temperature, vibration  COORDINATION:   finger-nose-finger, fine finger movements normal  REFLEXES:   deep tendon reflexes TRACE and symmetric  GAIT/STATION:   narrow based gait; USES CANE     DIAGNOSTIC DATA (LABS, IMAGING, TESTING) - I reviewed patient records, labs, notes, testing and imaging myself where available.  Lab Results  Component Value Date   WBC 8.0 06/01/2016   HGB 13.9 06/01/2016   HCT 40.3 06/01/2016   MCV 91.7 06/01/2016   PLT 259.0 06/01/2016      Component Value Date/Time   NA 130 (L) 06/01/2016 0944   K 3.9 06/01/2016 0944   CL 92 (L) 06/01/2016 0944   CO2 24 06/01/2016 0944   GLUCOSE 185 (H) 06/01/2016 0944   BUN 11 06/01/2016 0944   CREATININE 0.75 06/01/2016 0944   CALCIUM 9.6 06/01/2016 0944   PROT 7.4 06/01/2016 0944   ALBUMIN 4.4 06/01/2016 0944   AST 57 (H) 06/01/2016 0944   ALT 42 (H) 06/01/2016 0944   ALKPHOS 64 06/01/2016 0944   BILITOT 0.5 06/01/2016 0944   GFRNONAA >60 06/02/2015 0929   GFRAA >60 06/02/2015 0929   Lab Results  Component Value Date   CHOL 170 06/01/2016   HDL 62.50 06/01/2016   LDLCALC  54 05/22/2014   LDLDIRECT 88.0 06/01/2016   TRIG 236.0 (H) 06/01/2016   CHOLHDL 3 06/01/2016   Lab Results  Component Value Date   HGBA1C 7.1 (H) 06/01/2016   No results found for: [id number] Lab Results  Component Value Date   TSH 0.82 06/01/2016     07/16/20 CAROTID U/S Right Carotid: Velocities in the right ICA are consistent with a 1-39%  stenosis.   Left Carotid: Velocities in the left ICA are consistent with a 1-39%  stenosis.   Vertebrals: Bilateral vertebral arteries demonstrate antegrade flow.    12/16/19 TTE 1. Definity used; normal LV systolic function; grade 1 diastolic  dysfunction.  2. Left ventricular ejection fraction, by estimation, is 60 to 65%. The  left ventricle has normal function. The left ventricle has no regional  wall motion abnormalities. Left ventricular diastolic parameters are  consistent with Grade I diastolic  dysfunction (impaired relaxation).  3. Right ventricular systolic function is normal. The right ventricular  size is normal. Tricuspid regurgitation signal is inadequate for assessing  PA pressure.  4. The mitral valve is normal in structure. Trivial mitral valve  regurgitation. No evidence of mitral stenosis.  5. The aortic valve has an indeterminant number of cusps. Aortic valve  regurgitation is not visualized. No aortic stenosis is present.      ASSESSMENT AND PLAN  74 y.o. year old female here with recurrent unprovoked syncope since March 2021.   Dx:  1. Syncope, unspecified syncope type      PLAN:  RECURRENT UNPROVOKED SYNCOPE (March - Nov 2021) - check MRI brain, EEG for seizure work up   - caution with living alone  - According to [employer],  you can not drive unless you are seizure / syncope free for at least 6 months and under physician's care.   - Please maintain precautions. Do not participate in activities where a loss of awareness could harm you or someone else. No swimming alone, no tub bathing, no  hot tubs, no driving, no operating motorized vehicles (cars, ATVs, motocycles, etc), lawnmowers, power tools or firearms. No standing at heights, such as rooftops, ladders or stairs. Avoid hot objects such as stoves, heaters, open fires. Wear a helmet when riding a bicycle, scooter, skateboard, etc. and avoid areas of traffic. Set your water heater to 120 degrees or less.   Orders Placed This Encounter  Procedures   MR BRAIN W WO CONTRAST   EEG adult   Return in about 6 months (around 01/25/2021).    Penni Bombard, MD 48/88/9169, 4:50 PM Certified in Neurology, Neurophysiology and Neuroimaging  [facility] [address], [place], [place] [address] [phone]

## 2020-08-01 ENCOUNTER — Telehealth: Payer: Self-pay | Admitting: Diagnostic Neuroimaging

## 2020-08-01 NOTE — Telephone Encounter (Signed)
[employer] medicare order sent to [facility]. No auth they will reach out to the patient to schedule.  

## 2020-08-12 ENCOUNTER — Ambulatory Visit: Payer: Medicare Other | Admitting: Diagnostic Neuroimaging

## 2020-08-12 DIAGNOSIS — R55 Syncope and collapse: Secondary | ICD-10-CM | POA: Diagnosis not present

## 2020-08-18 ENCOUNTER — Other Ambulatory Visit: Payer: Medicare Other

## 2020-08-31 ENCOUNTER — Ambulatory Visit
Admission: RE | Admit: 2020-08-31 | Discharge: 2020-08-31 | Disposition: A | Discharge status: Home / Self Care | Payer: Medicare Other | Source: Ambulatory Visit | Attending: Diagnostic Neuroimaging | Admitting: Diagnostic Neuroimaging

## 2020-08-31 DIAGNOSIS — R55 Syncope and collapse: Secondary | ICD-10-CM

## 2020-08-31 MED ORDER — GADOBENATE DIMEGLUMINE 529 MG/ML IV SOLN
20.0000 mL | Freq: Once | INTRAVENOUS | Status: AC | PRN
  Administered 2020-08-31: 20 mL via INTRAVENOUS

## 2020-09-04 NOTE — Procedures (Signed)
   [facility]  EEG (ELECTROENCEPHALOGRAM) REPORT   STUDY DATE: 08/12/20 PATIENT NAME: Cynthia [name] DOB: [birth year] MRN: [id number]  ORDERING CLINICIAN: Andrey Spearman, MD   TECHNOLOGIST: Babs Bertin  TECHNIQUE: Electroencephalogram was recorded utilizing standard 10-20 system of lead placement and reformatted into average and bipolar montages.  RECORDING TIME: 29 minutes  ACTIVATION: hyperventilation and photic stimulation  CLINICAL INFORMATION: 75 year old female with recurrent loss of consciousness.  FINDINGS: Posterior dominant background rhythms, which attenuate with eye opening, ranging 11-12 hertz and 30-35 microvolts. No focal, lateralizing, epileptiform activity or seizures are seen. Patient recorded in the awake and drowsy state. EKG channel shows regular rhythm of 70-80 beats per minute.   IMPRESSION:   Normal EEG in the awake and drowsy states.    INTERPRETING PHYSICIAN:  Penni Bombard, MD Certified in Neurology, Neurophysiology and Neuroimaging  [facility] [address], [place], [place] [address] [phone]

## 2020-09-14 ENCOUNTER — Encounter: Payer: Self-pay | Admitting: *Deleted

## 2020-10-16 ENCOUNTER — Encounter: Payer: Self-pay | Admitting: *Deleted

## 2020-10-19 ENCOUNTER — Ambulatory Visit: Payer: Medicare Other | Admitting: Diagnostic Neuroimaging

## 2020-10-19 ENCOUNTER — Encounter: Payer: Self-pay | Admitting: Diagnostic Neuroimaging

## 2020-10-19 VITALS — BP 150/77 | HR 97 | Ht 62.0 in | Wt 208.8 lb

## 2020-10-19 DIAGNOSIS — M79605 Pain in left leg: Secondary | ICD-10-CM | POA: Diagnosis not present

## 2020-10-19 DIAGNOSIS — R55 Syncope and collapse: Secondary | ICD-10-CM | POA: Diagnosis not present

## 2020-10-19 DIAGNOSIS — R413 Other amnesia: Secondary | ICD-10-CM

## 2020-10-19 NOTE — Progress Notes (Signed)
[facility]  PATIENT: Cynthia [name] DOB: November 24, 1945  REFERRING CLINICIAN: Velna Hatchet, MD HISTORY FROM: patient and sister in law REASON FOR VISIT: follow up   [place]:  Chief Complaint  Patient presents with  . Memory Loss    Rm 7 sister-in-law, Betty  MMSE 24  . Dizziness    "Falling, unsure what happens, I just fall, using a rolator at home"    HISTORY OF PRESENT ILLNESS:   UPDATE (10/19/20, VRP): Since last visit, doing no more recurrent syncope.  Patient does report onset of some short-term memory problem and attention difficulties since November 2021.  Patient still living alone.  No change in ADLs.  She is able to maintain all of her own personal affairs.  She has given up driving due to getting lost one time and also because of her syncopal events.  She has been more socially withdrawn since Covid pandemic.  Continues to have issues with low back pain and left leg pain and weakness.  PRIOR HPI (07/27/20):  75 year old female here for evaluation of recurrent syncope.  Since March 2021 she has had 4 episodes of unprovoked loss of consciousness.  Patient can be standing up, and then all of a sudden she wakes up several hours later on the floor.  No prodromal lightheadedness or dizziness.  No tongue biting or incontinence.  Afterward she feels weak.  No warning for these events.  No particular pattern.  She has had a total of 4 episodes over past 9 months.  Patient lives alone.  She has chronic back pain issues.  She has chronic left leg weakness from her back issues.  She uses a cane.  She has some help from family members who live 20 to 30 minutes away.   REVIEW OF SYSTEMS: Full 14 system review of systems performed and negative with exception of: As per HPI.  ALLERGIES: No Known Allergies  HOME MEDICATIONS: Outpatient Medications Prior to Visit  Medication Sig Dispense Refill  . amLODipine (NORVASC) 5 MG tablet Take 5 mg by mouth  daily.    Marland Kitchen aspirin EC 81 MG tablet Take 81 mg by mouth at bedtime.    . Calcium Carbonate Antacid (TUMS E-X PO) Take by mouth as needed.    . cholecalciferol (VITAMIN D) 1000 UNITS tablet Take 2,000 Units by mouth daily.     . famotidine (PEPCID) 10 MG tablet Take 10 mg by mouth as needed.    . ferrous sulfate 325 (65 FE) MG tablet Take 325 mg by mouth daily with breakfast.    . hydrochlorothiazide (HYDRODIURIL) 25 MG tablet Take 0.5 tablets (12.5 mg total) by mouth daily. 45 tablet 3  . lisinopril (ZESTRIL) 40 MG tablet Take 40 mg by mouth daily.    . metFORMIN (GLUCOPHAGE) 500 MG tablet TAKE 1 TABLET BY MOUTH TWICE DAILY WITH A MEAL 180 tablet 0  . Multiple Vitamins-Minerals (MULTIPLE VITAMINS/WOMENS PO) Take 1 tablet by mouth daily.    . naproxen sodium (ANAPROX) 220 MG tablet Take 440 mg by mouth daily as needed (back pain).     . Omega-3 Fatty Acids (FISH OIL) 1000 MG CAPS Take 2 capsules (2,000 mg total) by mouth daily. 60 capsule 6  . potassium chloride SA (K-DUR,KLOR-CON) 20 MEQ tablet TAKE 1 TABLET(20 MEQ) BY MOUTH DAILY 90 tablet 0  . simvastatin (ZOCOR) 40 MG tablet Take 1 tablet (40 mg total) by mouth at bedtime. 90 tablet 3  . vitamin B-12 (CYANOCOBALAMIN) 1000  MCG tablet Take 1,000 mcg by mouth daily.     No facility-administered medications prior to visit.    PAST MEDICAL HISTORY: Past Medical History:  Diagnosis Date  . Anxiety disorder   . Asthmatic bronchitis   . Colonic polyp   . Degenerative joint disease   . Diabetes mellitus   . Dizziness   . Headache(784.0)   . Hypercholesteremia   . Hypertension   . Lumbar back pain   . Memory changes   . Morbid obesity ([place])   . Syncope    recurrent  . Syncope   . Venous insufficiency     PAST SURGICAL HISTORY: Past Surgical History:  Procedure Laterality Date  . CARPAL TUNNEL RELEASE    . CATARACT EXTRACTION  2016  . COLONOSCOPY    . DILATION AND CURETTAGE OF UTERUS    . LUMBAR LAMINECTOMY  1997   Dr.  Quentin Cornwall  . PELVIC ABCESS DRAINAGE  1970   Dr. Marjory Sneddon  . POLYPECTOMY      FAMILY HISTORY: Family History  Problem Relation Age of Onset  . Heart disease Mother   . Diabetes Mother   . Heart attack Mother        deceased 6  . Heart disease Father   . Heart attack Father        prior to 1  . Diabetes Brother   . Diabetes Maternal Grandmother   . Diabetes Paternal Grandmother   . Diabetes Brother   . Stroke Brother   . Hypertension Brother   . Heart failure Brother   . Diabetes Other        sibling    SOCIAL HISTORY: Social History   Socioeconomic History  . Marital status: Divorced    Spouse name: Not on file  . Number of children: 0  . Years of education: 27  . Highest education level: Not on file  Occupational History  . Occupation: retired  Tobacco Use  . Smoking status: Former Smoker    Packs/day: 0.40    Years: 6.00    Pack years: 2.40    Types: Cigarettes    Quit date: 08/01/1978    Years since quitting: 42.2  . Smokeless tobacco: Never Used  Substance and Sexual Activity  . Alcohol use: No    Alcohol/week: 0.0 standard drinks  . Drug use: No  . Sexual activity: Not on file  Other Topics Concern  . Not on file  Social History Narrative   10/16/20 Lives alone in an apartment on the second floor.  Has no children.  Retired Sales promotion account executive.  Education: high school.   Social Determinants of Health   Financial Resource Strain: Not on file  Food Insecurity: Not on file  Transportation Needs: Not on file  Physical Activity: Not on file  Stress: Not on file  Social Connections: Not on file  Intimate Partner Violence: Not on file     PHYSICAL EXAM  GENERAL EXAM/CONSTITUTIONAL: Vitals:  Vitals:   10/19/20 1409  BP: (!) 150/77  Pulse: 97  Weight: 208 lb 12.8 oz (94.7 kg)  Height: 5\' 2"  (1.575 m)   Body mass index is 38.19 kg/m. Wt Readings from Last 3 Encounters:  10/19/20 208 lb 12.8 oz (94.7 kg)  07/27/20 213 lb 6.4 oz (96.8 kg)   11/26/19 213 lb (96.6 kg)    Patient is in no distress; well developed, nourished and groomed; neck is supple  CARDIOVASCULAR:  Examination of carotid arteries is normal; no carotid bruits  Regular rate and rhythm, no murmurs  Examination of peripheral vascular system by observation and palpation is normal  EYES:  Ophthalmoscopic exam of optic discs and posterior segments is normal; no papilledema or hemorrhages No exam data present  MUSCULOSKELETAL:  Gait, strength, tone, movements noted in Neurologic exam below  NEUROLOGIC: MENTAL STATUS:  MMSE - Mini Mental State Exam 10/19/2020  Orientation to time 4  Orientation to Place 4  Registration 3  Attention/ Calculation 5  Recall 0  Language- name 2 objects 2  Language- repeat 0  Language- follow 3 step command 3  Language- read & follow direction 1  Write a sentence 1  Copy design 1  Total score 24    awake, alert, oriented to person, place and time  recent and remote memory intact  normal attention and concentration  language fluent, comprehension intact, naming intact  fund of knowledge appropriate  CRANIAL NERVE:   2nd - no papilledema on fundoscopic exam  2nd, 3rd, 4th, 6th - pupils --> RIGHT 3MM NO REACTION, LEFT 5MM NO REACTION; visual fields full to confrontation, extraocular muscles intact, no nystagmus  5th - facial sensation symmetric  7th - facial strength symmetric  8th - hearing intact  9th - palate elevates symmetrically, uvula midline  11th - shoulder shrug symmetric  12th - tongue protrusion midline  MOTOR:   normal bulk and tone, full strength in the BUE, RLE; LLE 3-4  SENSORY:   normal and symmetric to light touch, temperature, vibration  COORDINATION:   finger-nose-finger, fine finger movements normal  REFLEXES:   deep tendon reflexes TRACE and symmetric  GAIT/STATION:   narrow based gait; USES CANE     DIAGNOSTIC DATA (LABS, IMAGING, TESTING) - I reviewed  patient records, labs, notes, testing and imaging myself where available.  Lab Results  Component Value Date   WBC 8.0 06/01/2016   HGB 13.9 06/01/2016   HCT 40.3 06/01/2016   MCV 91.7 06/01/2016   PLT 259.0 06/01/2016      Component Value Date/Time   NA 130 (L) 06/01/2016 0944   K 3.9 06/01/2016 0944   CL 92 (L) 06/01/2016 0944   CO2 24 06/01/2016 0944   GLUCOSE 185 (H) 06/01/2016 0944   BUN 11 06/01/2016 0944   CREATININE 0.75 06/01/2016 0944   CALCIUM 9.6 06/01/2016 0944   PROT 7.4 06/01/2016 0944   ALBUMIN 4.4 06/01/2016 0944   AST 57 (H) 06/01/2016 0944   ALT 42 (H) 06/01/2016 0944   ALKPHOS 64 06/01/2016 0944   BILITOT 0.5 06/01/2016 0944   GFRNONAA >60 06/02/2015 0929   GFRAA >60 06/02/2015 0929   Lab Results  Component Value Date   CHOL 170 06/01/2016   HDL 62.50 06/01/2016   LDLCALC 54 05/22/2014   LDLDIRECT 88.0 06/01/2016   TRIG 236.0 (H) 06/01/2016   CHOLHDL 3 06/01/2016   Lab Results  Component Value Date   HGBA1C 7.1 (H) 06/01/2016   No results found for: [id number] Lab Results  Component Value Date   TSH 0.82 06/01/2016     07/16/20 CAROTID U/S Right Carotid: Velocities in the right ICA are consistent with a 1-39%  stenosis.   Left Carotid: Velocities in the left ICA are consistent with a 1-39%  stenosis.   Vertebrals: Bilateral vertebral arteries demonstrate antegrade flow.    12/16/19 TTE 1. Definity used; normal LV systolic function; grade 1 diastolic  dysfunction.  2. Left ventricular ejection fraction, by estimation, is 60 to 65%. The  left ventricle has  normal function. The left ventricle has no regional  wall motion abnormalities. Left ventricular diastolic parameters are  consistent with Grade I diastolic  dysfunction (impaired relaxation).  3. Right ventricular systolic function is normal. The right ventricular  size is normal. Tricuspid regurgitation signal is inadequate for assessing  PA pressure.  4. The mitral valve  is normal in structure. Trivial mitral valve  regurgitation. No evidence of mitral stenosis.  5. The aortic valve has an indeterminant number of cusps. Aortic valve  regurgitation is not visualized. No aortic stenosis is present.    08/31/20 MRI brain [I reviewed images myself and agree with interpretation. -VRP]  -Stable, minimal scattered foci of nonspecific T2 hyperintensities / gliosis.  -No acute findings.  08/12/20 EEG - normal    ASSESSMENT AND PLAN  75 y.o. year old female here with recurrent unprovoked syncope since March 2021.   Dx:  1. Syncope, unspecified syncope type      PLAN:  MEMORY LOSS (MMSE 24/30; no major changes in ADLs due to memory issues; good insight; ddx: MCI, stress / sleep issues, pain issues) - check B12 level - safety / supervision issues reviewed - daily physical activity / exercise (at least 15-30 minutes) - eat more plants / vegetables - increase social activities, brain stimulation, games, puzzles, hobbies, crafts, arts, music - aim for at least 7-8 hours sleep per night (or more) - avoid smoking and alcohol - caregiver resources provided - caution with medications, finances, living alone   RECURRENT UNPROVOKED SYNCOPE (March - Nov 2021) - unclear etiology; follow up with PCP and cardiology - According to [facility] law, you can not drive unless you are seizure / syncope free for at least 6 months and under physician's care.  - Please maintain precautions. Do not participate in activities where a loss of awareness could harm you or someone else. No swimming alone, no tub bathing, no hot tubs, no driving, no operating motorized vehicles (cars, ATVs, motocycles, etc), lawnmowers, power tools or firearms. No standing at heights, such as rooftops, ladders or stairs. Avoid hot objects such as stoves, heaters, open fires. Wear a helmet when riding a bicycle, scooter, skateboard, etc. and avoid areas of traffic. Set your water heater to 120 degrees or  less.   CHRONIC LOW BACK PAIN / LEFT LEG PAIN -Refer to home PT for evaluation and treatment   Orders Placed This Encounter  Procedures  . Vitamin B12  . Home Health  . Face-to-face encounter (required for Medicare/Medicaid patients)   Return for return to PCP, pending if symptoms worsen or fail to improve.    Penni Bombard, MD 1/94/1740, 8:14 PM Certified in Neurology, Neurophysiology and Neuroimaging  [facility] [address] [place], [place] [address] [phone]

## 2020-10-19 NOTE — Patient Instructions (Signed)
MEMORY LOSS - safety / supervision issues reviewed - daily physical activity / exercise (at least 15-30 minutes) - eat more plants / vegetables - increase social activities, brain stimulation, games, puzzles, hobbies, crafts, arts, music - aim for at least 7-8 hours sleep per night (or more) - avoid smoking and alcohol - caregiver resources provided - caution with medications, finances, living alone   RECURRENT UNPROVOKED SYNCOPE (March - Nov 2021) - unclear etiology; follow up with PCP and cardiology - According to [facility] law, you can not drive unless you are seizure / syncope free for at least 6 months and under physician's care.  - Please maintain precautions. Do not participate in activities where a loss of awareness could harm you or someone else. No swimming alone, no tub bathing, no hot tubs, no driving, no operating motorized vehicles (cars, ATVs, motocycles, etc), lawnmowers, power tools or firearms. No standing at heights, such as rooftops, ladders or stairs. Avoid hot objects such as stoves, heaters, open fires. Wear a helmet when riding a bicycle, scooter, skateboard, etc. and avoid areas of traffic. Set your water heater to 120 degrees or less.

## 2020-10-20 ENCOUNTER — Encounter: Payer: Self-pay | Admitting: *Deleted

## 2020-10-20 LAB — VITAMIN B12: Vitamin B-12: 890 pg/mL (ref 232–1245)

## 2020-12-12 NOTE — Progress Notes (Deleted)
Cardiology Office Note:    Date:  12/12/2020   ID:  [name], DOB 1946/04/20, MRN [id number]  PCP:  Velna Hatchet, MD   [facility] Providers Cardiologist:  None {   Referring MD: Velna Hatchet, MD    History of Present Illness:    Cynthia [name] is a 75 y.o. female with a hx of DMII, HTN, and HLD who was previously followed by Dr. Meda Coffee for syncope who now presents to clinic for follow-up.  Last saw Dr. Meda Coffee on 11/26/19 after she presented with an episode of syncope. Specifically, she woke up at night to walk to the bathroom and the next thing she remembers she was on the floor. She hit her head and eye. TTE   She has significant family history of premature coronary artery disease, her brother had MI and bypass surgery in his early 11s, he still alive and being treated for CHF.  Her mother had a myocardial infarction at age 20 and her father had bypass surgery in her early 86s and eventually passed away after his second bypass surgery at age of 55. TTE 11/2019 with LVEF 60-65%, G1DD, no significant valve disease. Episode was thought to be vasovagal in nature.  Today,  Past Medical History:  Diagnosis Date  . Anxiety disorder   . Asthmatic bronchitis   . Colonic polyp   . Degenerative joint disease   . Diabetes mellitus   . Dizziness   . Headache(784.0)   . Hypercholesteremia   . Hypertension   . Lumbar back pain   . Memory changes   . Morbid obesity ([place])   . Syncope    recurrent  . Syncope   . Venous insufficiency     Past Surgical History:  Procedure Laterality Date  . CARPAL TUNNEL RELEASE    . CATARACT EXTRACTION  2016  . COLONOSCOPY    . DILATION AND CURETTAGE OF UTERUS    . LUMBAR LAMINECTOMY  1997   Dr. Quentin Cornwall  . PELVIC ABCESS DRAINAGE  1970   Dr. Marjory Sneddon  . POLYPECTOMY      Current Medications: No outpatient medications have been marked as taking for the 12/15/20 encounter (Appointment) with Freada Bergeron, MD.     Allergies:    Patient has no known allergies.   Social History   Socioeconomic History  . Marital status: Divorced    Spouse name: Not on file  . Number of children: 0  . Years of education: 39  . Highest education level: Not on file  Occupational History  . Occupation: retired  Tobacco Use  . Smoking status: Former Smoker    Packs/day: 0.40    Years: 6.00    Pack years: 2.40    Types: Cigarettes    Quit date: 08/01/1978    Years since quitting: 42.3  . Smokeless tobacco: Never Used  Substance and Sexual Activity  . Alcohol use: No    Alcohol/week: 0.0 standard drinks  . Drug use: No  . Sexual activity: Not on file  Other Topics Concern  . Not on file  Social History Narrative   10/16/20 Lives alone in an apartment on the second floor.  Has no children.  Retired Sales promotion account executive.  Education: high school.   Social Determinants of Health   Financial Resource Strain: Not on file  Food Insecurity: Not on file  Transportation Needs: Not on file  Physical Activity: Not on file  Stress: Not on file  Social Connections: Not on file  Family History: The patient's ***family history includes Diabetes in her brother, brother, maternal grandmother, mother, paternal grandmother, and another family member; Heart attack in her father and mother; Heart disease in her father and mother; Heart failure in her brother; Hypertension in her brother; Stroke in her brother.  ROS:   Please see the history of present illness.    *** All other systems reviewed and are negative.  EKGs/Labs/Other Studies Reviewed:    The following studies were reviewed today: TTE 2019/12/18: IMPRESSIONS    1. Definity used; normal LV systolic function; grade 1 diastolic  dysfunction.  2. Left ventricular ejection fraction, by estimation, is 60 to 65%. The  left ventricle has normal function. The left ventricle has no regional  wall motion abnormalities. Left ventricular diastolic parameters are  consistent with  Grade I diastolic  dysfunction (impaired relaxation).  3. Right ventricular systolic function is normal. The right ventricular  size is normal. Tricuspid regurgitation signal is inadequate for assessing  PA pressure.  4. The mitral valve is normal in structure. Trivial mitral valve  regurgitation. No evidence of mitral stenosis.  5. The aortic valve has an indeterminant number of cusps. Aortic valve  regurgitation is not visualized. No aortic stenosis is present.   EKG:  EKG is *** ordered today.  The ekg ordered today demonstrates ***  Recent Labs: No results found for requested labs within last 8760 hours.  Recent Lipid Panel    Component Value Date/Time   CHOL 170 06/01/2016 0944   TRIG 236.0 (H) 06/01/2016 0944   HDL 62.50 06/01/2016 0944   CHOLHDL 3 06/01/2016 0944   VLDL 47.2 (H) 06/01/2016 0944   LDLCALC 54 05/22/2014 0937   LDLDIRECT 88.0 06/01/2016 0944     Risk Assessment/Calculations:   {Does this patient have ATRIAL FIBRILLATION?[phone]}   Physical Exam:    VS:  There were no vitals taken for this visit.    Wt Readings from Last 3 Encounters:  10/19/20 208 lb 12.8 oz (94.7 kg)  07/27/20 213 lb 6.4 oz (96.8 kg)  11/26/19 213 lb (96.6 kg)     GEN: *** Well nourished, well developed in no acute distress HEENT: Normal NECK: No JVD; No carotid bruits LYMPHATICS: No lymphadenopathy CARDIAC: ***RRR, no murmurs, rubs, gallops RESPIRATORY:  Clear to auscultation without rales, wheezing or rhonchi  ABDOMEN: Soft, non-tender, non-distended MUSCULOSKELETAL:  No edema; No deformity  SKIN: Warm and dry NEUROLOGIC:  Alert and oriented x 3 PSYCHIATRIC:  Normal affect   ASSESSMENT:    No diagnosis found. PLAN:    In order of problems listed above:  #Vasovagal Syncope: TTE with normal LVEF, no significant valve abnormalities. No recurrence. -Continue hydration -Countermaneuvers as needed  #HTN: -Continue amlodipine 5mg  daily -Continue lisinopril  40mg  faily -Continue HCTX 25mg  daily  #DMII: Managed by PCP. -Continue metformin 500mg  BID  #HLD: LDL 61 -Continue simva 40mg  daily  #Family history of CAD: -Continue ASA 34m gdaily -Continue simva 40mg  daily as above -Continue lifestyle modifications  {Are you ordering a CV Procedure (e.g. stress test, cath, DCCV, TEE, etc)?   Press F2        :[id number]}    Medication Adjustments/Labs and Tests Ordered: Current medicines are reviewed at length with the patient today.  Concerns regarding medicines are outlined above.  No orders of the defined types were placed in this encounter.  No orders of the defined types were placed in this encounter.   There are no Patient Instructions on file for this visit.  Signed, Freada Bergeron, MD  12/12/2020 5:17 PM    [place]

## 2020-12-14 NOTE — Progress Notes (Signed)
Cardiology Office Note:    Date:  12/15/2020   ID:  Cynthia [name], DOB 09/30/1945, MRN [id number]  PCP:  Cynthia Hatchet, MD   [facility] Providers Cardiologist:  None {   Referring MD: Cynthia Hatchet, MD    History of Present Illness:    Cynthia [name] is a 75 y.o. female with a hx of DMII, HTN, and HLD who was previously followed by Dr. Meda [name] for syncope who now presents to clinic for follow-up.  Last saw Dr. Meda [name] on 11/26/19 after she presented with an episode of syncope. Specifically, she woke up at night to walk to the bathroom and the next thing she remembers she was on the floor. She hit her head and eye. TTE   She has significant family history of premature coronary artery disease, her brother had MI and bypass surgery in his early 62s, he still alive and being treated for CHF.  Her mother had a myocardial infarction at age 35 and her father had bypass surgery in her early 29s and eventually passed away after his second bypass surgery at age of 37. TTE 11/2019 with LVEF 60-65%, G1DD, no significant valve disease. Episode was thought to be vasovagal in nature.  Today, she is feeling okay overall. She reports no more syncopal episodes. Occasionally she still has dizzy spells, usually early in the morning while she is moving around or getting up. However, she has not had any dizzy spells recently and does not appear too concerned about this. She denies any chest pain, shortness of breath, palpitations, or exertional symptoms. No headaches or syncope to report. Also has no lower extremity edema, orthopnea or PND.  She does well with staying hydrated throughout the day. Also, she reports her diabetes is well controlled at this time. Her PCP is monitoring her cholesterol.  Of note, she has access to a blood pressure cuff but is unsure of how to use it.   Past Medical History:  Diagnosis Date  . Anxiety disorder   . Asthmatic bronchitis   . Colonic polyp   . Degenerative joint  disease   . Diabetes mellitus   . Dizziness   . Headache(784.0)   . Hypercholesteremia   . Hypertension   . Lumbar back pain   . Memory changes   . Morbid obesity ([place])   . Syncope    recurrent  . Syncope   . Venous insufficiency     Past Surgical History:  Procedure Laterality Date  . CARPAL TUNNEL RELEASE    . CATARACT EXTRACTION  2016  . COLONOSCOPY    . DILATION AND CURETTAGE OF UTERUS    . LUMBAR LAMINECTOMY  1997   Dr. Quentin [name]  . PELVIC ABCESS DRAINAGE  1970   Dr. Marjory [name]  . POLYPECTOMY      Current Medications: Current Meds  Medication Sig  . amLODipine (NORVASC) 5 MG tablet Take 5 mg by mouth daily.  Marland Kitchen aspirin EC 81 MG tablet Take 81 mg by mouth at bedtime.  . Calcium Carbonate Antacid (TUMS E-X PO) Take by mouth as needed.  . cholecalciferol (VITAMIN D) 1000 UNITS tablet Take 2,000 Units by mouth daily.   . famotidine (PEPCID) 10 MG tablet Take 10 mg by mouth as needed.  . ferrous sulfate 325 (65 FE) MG tablet Take 325 mg by mouth daily with breakfast.  . hydrochlorothiazide (HYDRODIURIL) 25 MG tablet Take 25 mg by mouth daily.  Marland Kitchen lisinopril (ZESTRIL) 40 MG tablet Take 40 mg by  mouth daily.  . metFORMIN (GLUCOPHAGE) 500 MG tablet TAKE 1 TABLET BY MOUTH TWICE DAILY WITH A MEAL  . Multiple Vitamins-Minerals (MULTIPLE VITAMINS/WOMENS PO) Take 1 tablet by mouth daily.  . naproxen sodium (ANAPROX) 220 MG tablet Take 440 mg by mouth daily as needed (back pain).   . Omega-3 Fatty Acids (FISH OIL) 1000 MG CAPS Take 2 capsules (2,000 mg total) by mouth daily.  . potassium chloride SA (K-DUR,KLOR-CON) 20 MEQ tablet TAKE 1 TABLET(20 MEQ) BY MOUTH DAILY  . simvastatin (ZOCOR) 40 MG tablet Take 1 tablet (40 mg total) by mouth at bedtime.  . vitamin B-12 (CYANOCOBALAMIN) 1000 MCG tablet Take 1,000 mcg by mouth daily.     Allergies:   Patient has no known allergies.   Social History   Socioeconomic History  . Marital status: Divorced    Spouse name: Not on file  .  Number of children: 0  . Years of education: 61  . Highest education level: Not on file  Occupational History  . Occupation: retired  Tobacco Use  . Smoking status: Former Smoker    Packs/day: 0.40    Years: 6.00    Pack years: 2.40    Types: Cigarettes    Quit date: 08/01/1978    Years since quitting: 42.4  . Smokeless tobacco: Never Used  Substance and Sexual Activity  . Alcohol use: No    Alcohol/week: 0.0 standard drinks  . Drug use: No  . Sexual activity: Not on file  Other Topics Concern  . Not on file  Social History Narrative   10/16/20 Lives alone in an apartment on the second floor.  Has no children.  Retired Sales promotion account executive.  Education: high school.   Social Determinants of Health   Financial Resource Strain: Not on file  Food Insecurity: Not on file  Transportation Needs: Not on file  Physical Activity: Not on file  Stress: Not on file  Social Connections: Not on file     Family History: The patient's family history includes Diabetes in her brother, brother, maternal grandmother, mother, paternal grandmother, and another family member; Heart attack in her father and mother; Heart disease in her father and mother; Heart failure in her brother; Hypertension in her brother; Stroke in her brother.  ROS:   Please see the history of present illness.    Review of Systems  Constitutional: Negative for chills, fever and malaise/fatigue.  HENT: Negative for nosebleeds, sinus pain and tinnitus.   Eyes: Negative for blurred vision, photophobia and redness.  Respiratory: Negative for cough, shortness of breath and wheezing.   Cardiovascular: Negative for chest pain, palpitations, orthopnea, claudication, leg swelling and PND.  Gastrointestinal: Negative for diarrhea, heartburn, nausea and vomiting.  Genitourinary: Negative for dysuria and hematuria.  Musculoskeletal: Negative for back pain, falls and joint pain.  Neurological: Positive for dizziness. Negative for  seizures, loss of consciousness and headaches.  Endo/Heme/Allergies: Does not bruise/bleed easily.  Psychiatric/Behavioral: Negative for memory loss. The patient is not nervous/anxious and does not have insomnia.      EKGs/Labs/Other Studies Reviewed:    The following studies were reviewed today:  TTE 12/16/19: IMPRESSIONS  1. Definity used; normal LV systolic function; grade 1 diastolic  dysfunction.  2. Left ventricular ejection fraction, by estimation, is 60 to 65%. The  left ventricle has normal function. The left ventricle has no regional  wall motion abnormalities. Left ventricular diastolic parameters are  consistent with Grade I diastolic  dysfunction (impaired relaxation).  3. Right ventricular  systolic function is normal. The right ventricular  size is normal. Tricuspid regurgitation signal is inadequate for assessing  PA pressure.  4. The mitral valve is normal in structure. Trivial mitral valve  regurgitation. No evidence of mitral stenosis.  5. The aortic valve has an indeterminant number of cusps. Aortic valve  regurgitation is not visualized. No aortic stenosis is present.   EKG:   12/15/2020: NSR, Low voltage, poor R wave progression in precordial leads.  Recent Labs: No results found for requested labs within last 8760 hours.  Recent Lipid Panel    Component Value Date/Time   CHOL 170 06/01/2016 0944   TRIG 236.0 (H) 06/01/2016 0944   HDL 62.50 06/01/2016 0944   CHOLHDL 3 06/01/2016 0944   VLDL 47.2 (H) 06/01/2016 0944   LDLCALC 54 05/22/2014 0937   LDLDIRECT 88.0 06/01/2016 0944      Physical Exam:    VS:  BP 111/80 (BP Location: Right Arm, Patient Position: Sitting)   Pulse 100   Ht 5\' 2"  (1.575 m)   Wt 202 lb 9.6 oz (91.9 kg)   SpO2 95%   BMI 37.06 kg/m     Wt Readings from Last 3 Encounters:  12/15/20 202 lb 9.6 oz (91.9 kg)  10/19/20 208 lb 12.8 oz (94.7 kg)  07/27/20 213 lb 6.4 oz (96.8 kg)     GEN: Elderly female, comfortable,  NAD HEENT: Normal NECK: No JVD; No carotid bruits CARDIAC: RRR, no murmurs, rubs, gallops RESPIRATORY:  Clear to auscultation without rales, wheezing or rhonchi  ABDOMEN: Soft, non-tender, non-distended MUSCULOSKELETAL:  No edema; No deformity. Ambulates with a cane SKIN: Warm and dry NEUROLOGIC:  Alert and oriented x 3 PSYCHIATRIC:  Normal affect   ASSESSMENT:    1. Syncope and collapse   2. Essential hypertension   3. Hyperlipidemia, unspecified hyperlipidemia type   4. Family history of early CAD    PLAN:    In order of problems listed above:  #Vasovagal Syncope: Significantly improved with no recurrence of symptoms since last evaluation. TTE with normal LVEF, no significant valve abnormalities. Maintains good hydration.  -Continue conservative measures -Continue hydration and leg elevation -Compression socks as needed  #HTN: Initially elevated in the office but back to 110s/70s on re-check. Does not monitor at home. -Continue amlodipine 5mg  daily -Continue lisinopril 40mg  faily -Continue HCTZ 25mg  daily  #DMII: Managed by PCP. A1C 7.3 -Continue metformin 500mg  BID  #HLD: LDL 67 on 07/07/20. -Continue simva 40mg  daily  #Family history of CAD: -Continue ASA 35m gdaily -Continue simva 40mg  daily as above -Continue lifestyle modifications      Follow-up in 1 year.  Medication Adjustments/Labs and Tests Ordered: Current medicines are reviewed at length with the patient today.  Concerns regarding medicines are outlined above.  Orders Placed This Encounter  Procedures  . EKG 12-Lead   No orders of the defined types were placed in this encounter.   Patient Instructions  Medication Instructions:   Your physician recommends that you continue on your current medications as directed. Please refer to the Current Medication list given to you today.  *If you need a refill on your cardiac medications before your next appointment, please call your  pharmacy*   Follow-Up: At [facility], you and your health needs are our priority.  As part of our continuing mission to provide you with exceptional heart care, we have created designated Provider Care Teams.  These Care Teams include your primary Cardiologist (physician) and Advanced Practice Providers (APPs -  Physician Assistants and Nurse Practitioners) who all work together to provide you with the care you need, when you need it.  We recommend signing up for the patient portal called "MyChart".  Sign up information is provided on this After Visit Summary.  MyChart is used to connect with patients for Virtual Visits (Telemedicine).  Patients are able to view lab/test results, encounter notes, upcoming appointments, etc.  Non-urgent messages can be sent to your provider as well.   To learn more about what you can do with MyChart, go to [email or web address].    Your next appointment:   1 year(s)  The format for your next appointment:   In Person  Provider:   Gwyndolyn Kaufman, MD       [facility],acting as a scribe for Freada Bergeron, MD.,have documented all relevant documentation on the behalf of Freada Bergeron, MD,as directed by  Freada Bergeron, MD while in the presence of Freada Bergeron, MD.  I, Freada Bergeron, MD, have reviewed all documentation for this visit. The documentation on 12/15/20 for the exam, diagnosis, procedures, and orders are all accurate and complete.  Signed, Freada Bergeron, MD  12/15/2020 10:43 AM    [place]

## 2020-12-15 ENCOUNTER — Encounter: Payer: Self-pay | Admitting: Cardiology

## 2020-12-15 ENCOUNTER — Other Ambulatory Visit: Payer: Self-pay

## 2020-12-15 ENCOUNTER — Ambulatory Visit: Payer: Medicare Other | Admitting: Cardiology

## 2020-12-15 VITALS — BP 111/80 | HR 100 | Ht 62.0 in | Wt 202.6 lb

## 2020-12-15 DIAGNOSIS — I10 Essential (primary) hypertension: Secondary | ICD-10-CM

## 2020-12-15 DIAGNOSIS — R55 Syncope and collapse: Secondary | ICD-10-CM | POA: Diagnosis not present

## 2020-12-15 DIAGNOSIS — Z8249 Family history of ischemic heart disease and other diseases of the circulatory system: Secondary | ICD-10-CM

## 2020-12-15 DIAGNOSIS — E785 Hyperlipidemia, unspecified: Secondary | ICD-10-CM | POA: Diagnosis not present

## 2020-12-15 NOTE — Patient Instructions (Signed)

## 2021-01-25 ENCOUNTER — Ambulatory Visit: Payer: Medicare Other | Admitting: Diagnostic Neuroimaging

## 2021-10-13 ENCOUNTER — Encounter: Payer: Self-pay | Admitting: *Deleted

## 2021-10-18 ENCOUNTER — Encounter: Payer: Self-pay | Admitting: Diagnostic Neuroimaging

## 2021-10-18 ENCOUNTER — Ambulatory Visit: Payer: Medicare Other | Admitting: Diagnostic Neuroimaging

## 2021-10-18 VITALS — BP 132/71 | HR 88 | Ht 62.0 in | Wt 203.0 lb

## 2021-10-18 DIAGNOSIS — F03A0 Unspecified dementia, mild, without behavioral disturbance, psychotic disturbance, mood disturbance, and anxiety: Secondary | ICD-10-CM

## 2021-10-18 DIAGNOSIS — R413 Other amnesia: Secondary | ICD-10-CM

## 2021-10-18 NOTE — Patient Instructions (Addendum)
?  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

## 2021-10-18 NOTE — Progress Notes (Signed)
? ?[facility] ? ?PATIENT: Cynthia [name] ?DOB: September 21, 1945 ? ?REFERRING CLINICIAN: Velna Hatchet, MD ?HISTORY FROM: patient ?REASON FOR VISIT: new consult ? ? ?HISTORICAL ? ?CHIEF COMPLAINT:  ?Chief Complaint  ?Patient presents with  ? Memory Loss  ?  Rm 7, Est patient , sister-in-law Inez Catalina  MMSE 22  ? Dizziness  ? ? ?HISTORY OF PRESENT ILLNESS:  ? ?UPDATE (10/18/21, VRP): Since last visit, more short term memory loss. Symptoms are progressive. Mainly stays home. No more driving (got lost). Still living alone.  ? ?UPDATE (10/19/20, VRP): Since last visit, doing no more recurrent syncope.  Patient does report onset of some short-term memory problem and attention difficulties since November 2021.  Patient still living alone.  No change in ADLs.  She is able to maintain all of her own personal affairs.  She has given up driving due to getting lost one time and also because of her syncopal events.  She has been more socially withdrawn since Covid pandemic.  Continues to have issues with low back pain and left leg pain and weakness. ?  ?PRIOR HPI (07/27/20):  76 year old female here for evaluation of recurrent syncope.  Since March 2021 she has had 4 episodes of unprovoked loss of consciousness.  Patient can be standing up, and then all of a sudden she wakes up several hours later on the floor.  No prodromal lightheadedness or dizziness.  No tongue biting or incontinence.  Afterward she feels weak.  No warning for these events.  No particular pattern.  She has had a total of 4 episodes over past 9 months. ?  ?Patient lives alone.  She has chronic back pain issues.  She has chronic left leg weakness from her back issues.  She uses a cane.  She has some help from family members who live 20 to 30 minutes away. ?  ? ?REVIEW OF SYSTEMS: Full 14 system review of systems performed and negative with exception of: as per HPI. ? ?ALLERGIES: ?No Known Allergies ? ?HOME MEDICATIONS: ?Outpatient Medications Prior to  Visit  ?Medication Sig Dispense Refill  ? amLODipine (NORVASC) 5 MG tablet Take 5 mg by mouth daily.    ? aspirin EC 81 MG tablet Take 81 mg by mouth at bedtime.    ? Calcium Carbonate Antacid (TUMS E-X PO) Take by mouth as needed.    ? cholecalciferol (VITAMIN D) 1000 UNITS tablet Take 2,000 Units by mouth daily.     ? famotidine (PEPCID) 10 MG tablet Take 10 mg by mouth as needed.    ? ferrous sulfate 325 (65 FE) MG tablet Take 325 mg by mouth daily with breakfast.    ? hydrochlorothiazide (HYDRODIURIL) 25 MG tablet Take 25 mg by mouth daily.    ? lisinopril (ZESTRIL) 40 MG tablet Take 40 mg by mouth daily.    ? metFORMIN (GLUCOPHAGE) 500 MG tablet TAKE 1 TABLET BY MOUTH TWICE DAILY WITH A MEAL 180 tablet 0  ? Multiple Vitamins-Minerals (MULTIPLE VITAMINS/WOMENS PO) Take 1 tablet by mouth daily.    ? naproxen sodium (ANAPROX) 220 MG tablet Take 440 mg by mouth daily as needed (back pain).     ? Omega-3 Fatty Acids (FISH OIL) 1000 MG CAPS Take 2 capsules (2,000 mg total) by mouth daily. 60 capsule 6  ? potassium chloride SA (K-DUR,KLOR-CON) 20 MEQ tablet TAKE 1 TABLET(20 MEQ) BY MOUTH DAILY 90 tablet 0  ? simvastatin (ZOCOR) 40 MG tablet Take 1 tablet (40 mg total) by mouth at  bedtime. 90 tablet 3  ? vitamin B-12 (CYANOCOBALAMIN) 1000 MCG tablet Take 1,000 mcg by mouth daily.    ? ?No facility-administered medications prior to visit.  ? ? ?PAST MEDICAL HISTORY: ?Past Medical History:  ?Diagnosis Date  ? Anxiety disorder   ? Asthmatic bronchitis   ? Colonic polyp   ? Degenerative joint disease   ? Diabetes mellitus   ? Dizziness   ? Headache(784.0)   ? Hypercholesteremia   ? Hypertension   ? Lumbar back pain   ? Memory changes   ? Morbid obesity ([place])   ? Syncope   ? recurrent  ? Syncope   ? Venous insufficiency   ? ? ?PAST SURGICAL HISTORY: ?Past Surgical History:  ?Procedure Laterality Date  ? CARPAL TUNNEL RELEASE    ? CATARACT EXTRACTION  2016  ? COLONOSCOPY    ? DILATION AND CURETTAGE OF UTERUS    ? LUMBAR  LAMINECTOMY  1997  ? Dr. Quentin Cornwall  ? PELVIC ABCESS DRAINAGE  1970  ? Dr. Marjory Sneddon  ? POLYPECTOMY    ? ? ?FAMILY HISTORY: ?Family History  ?Problem Relation Age of Onset  ? Heart disease Mother   ? Diabetes Mother   ? Heart attack Mother   ?     deceased 40  ? Hypertension Father   ? Heart disease Father   ? Heart attack Father   ?     prior to 32  ? Diabetes Brother   ? Heart disease Brother   ? Diabetes Brother   ? Stroke Brother   ? Hypertension Brother   ? Heart failure Brother   ? Diabetes Maternal Grandmother   ? Diabetes Paternal Grandmother   ? Diabetes Other   ?     sibling  ? ? ?SOCIAL HISTORY: ?Social History  ? ?Socioeconomic History  ? Marital status: Divorced  ?  Spouse name: Not on file  ? Number of children: 0  ? Years of education: 33  ? Highest education level: Not on file  ?Occupational History  ? Occupation: retired  ?Tobacco Use  ? Smoking status: Former  ?  Packs/day: 0.40  ?  Years: 6.00  ?  Pack years: 2.40  ?  Types: Cigarettes  ?  Quit date: 08/01/1978  ?  Years since quitting: 43.2  ? Smokeless tobacco: Never  ?Substance and Sexual Activity  ? Alcohol use: No  ?  Alcohol/week: 0.0 standard drinks  ? Drug use: No  ? Sexual activity: Not on file  ?Other Topics Concern  ? Not on file  ?Social History Narrative  ? 10/16/20 Lives alone in an apartment on the second floor.  10/18/21  ? Has no children.  Retired Sales promotion account executive.  Education: high school.  ? ?Social Determinants of Health  ? ?Financial Resource Strain: Not on file  ?Food Insecurity: Not on file  ?Transportation Needs: Not on file  ?Physical Activity: Not on file  ?Stress: Not on file  ?Social Connections: Not on file  ?Intimate Partner Violence: Not on file  ? ? ? ?PHYSICAL EXAM ? ?GENERAL EXAM/CONSTITUTIONAL: ?Vitals:  ?Vitals:  ? 10/18/21 0841  ?BP: 132/71  ?Pulse: 88  ?Weight: 203 lb (92.1 kg)  ?Height: '5\' 2"'$  (1.575 m)  ? ?Body mass index is 37.13 kg/m?. ?Wt Readings from Last 3 Encounters:  ?10/18/21 203 lb (92.1 kg)  ?12/15/20  202 lb 9.6 oz (91.9 kg)  ?10/19/20 208 lb 12.8 oz (94.7 kg)  ? ?Patient is in no distress; well developed,  nourished and groomed; neck is supple ? ?CARDIOVASCULAR: ?Examination of carotid arteries is normal; no carotid bruits ?Regular rate and rhythm, no murmurs ?Examination of peripheral vascular system by observation and palpation is normal ? ?EYES: ?Ophthalmoscopic exam of optic discs and posterior segments is normal; no papilledema or hemorrhages ?No results found. ? ?MUSCULOSKELETAL: ?Gait, strength, tone, movements noted in Neurologic exam below ? ?NEUROLOGIC: ?MENTAL STATUS:  ?MMSE - Mini Mental State Exam 10/18/2021 10/19/2020  ?Orientation to time 2 4  ?Orientation to Place 5 4  ?Registration 3 3  ?Attention/ Calculation 3 5  ?Recall 0 0  ?Language- name 2 objects 2 2  ?Language- repeat 1 0  ?Language- follow 3 step command 3 3  ?Language- read & follow direction 1 1  ?Write a sentence 1 1  ?Copy design 1 1  ?Total score 22 24  ? ?awake, alert, oriented to person, place ?recent and remote memory intact ?DECR attention and concentration ?language fluent, comprehension intact, naming intact ?fund of knowledge appropriate ? ?CRANIAL NERVE:  ?2nd - no papilledema on fundoscopic exam ?2nd, 3rd, 4th, 6th - pupils --> RIGHT 3, LEFT 5; REACTIVE; extraocular muscles intact, no nystagmus ?5th - facial sensation symmetric ?7th - facial strength symmetric ?8th - hearing intact ?9th - palate elevates symmetrically, uvula midline ?11th - shoulder shrug symmetric ?12th - tongue protrusion midline ? ?MOTOR:  ?normal bulk and tone, full strength in the BUE, BLE ? ?SENSORY:  ?normal and symmetric to light touch ? ?COORDINATION:  ?finger-nose-finger, fine finger movements normal ? ?REFLEXES:  ?deep tendon reflexes TRACE and symmetric ? ?GAIT/STATION:  ?narrow based gait; USING CANE; SLOW AND CAUTIOUS ? ? ? ? ?DIAGNOSTIC DATA (LABS, IMAGING, TESTING) ?- I reviewed patient records, labs, notes, testing and imaging myself where  available. ? ?Lab Results  ?Component Value Date  ? WBC 8.0 06/01/2016  ? HGB 13.9 06/01/2016  ? HCT 40.3 06/01/2016  ? MCV 91.7 06/01/2016  ? PLT 259.0 06/01/2016  ? ?   ?Component Value Date/Time  ? NA 13

## 2021-12-13 NOTE — Progress Notes (Signed)
Cardiology Office Note:    Date:  12/21/2021   ID:  Cynthia [name], DOB [birth year], MRN [id number]  PCP:  Velna Hatchet, MD   [facility] Providers Cardiologist:  None {   Referring MD: Velna Hatchet, MD    History of Present Illness:    Cynthia [name] is a 76 y.o. female with a hx of DMII, HTN, and HLD who was previously followed by Dr. Meda Coffee for syncope who now presents to clinic for follow-up.  Last saw Dr. Meda Coffee on 11/26/19 after she presented with an episode of syncope. Specifically, she woke up at night to walk to the bathroom and the next thing she remembers she was on the floor. She hit her head and eye. TTE 11/2019 with LVEF 60-65%, G1DD, no significant valve disease. Episode was thought to be vasovagal in nature.  She has significant family history of premature coronary artery disease, her brother had MI and bypass surgery in his early 70s, he still alive and being treated for CHF.  Her mother had a myocardial infarction at age 25 and her father had bypass surgery in her early 34s and eventually passed away after his second bypass surgery at age of 7.   Was last seen in clinic on 11/2020 where she was doing well from a CV standpoint.   Today, the patient overall feels well. No chest pain, lightheadedness, dizziness. She ambulates with a walker at home but has to pause after a short distance due to fatigue/weakness. She has very little energy and has a difficult time with household chores. No falls. Blood pressure is well controlled. Tolerating medications without issues.   Past Medical History:  Diagnosis Date   Anxiety disorder    Asthmatic bronchitis    Colonic polyp    Degenerative joint disease    Diabetes mellitus    Dizziness    Headache(784.0)    Hypercholesteremia    Hypertension    Lumbar back pain    Memory changes    Morbid obesity ([place])    Syncope    recurrent   Syncope    Venous insufficiency     Past Surgical History:  Procedure Laterality  Date   CARPAL TUNNEL RELEASE     CATARACT EXTRACTION  2016   COLONOSCOPY     DILATION AND CURETTAGE OF UTERUS     LUMBAR LAMINECTOMY  1997   Dr. Quentin Cornwall   PELVIC ABCESS DRAINAGE  1970   Dr. Marjory Sneddon   POLYPECTOMY      Current Medications: Current Meds  Medication Sig   amLODipine (NORVASC) 5 MG tablet Take 5 mg by mouth daily.   aspirin EC 81 MG tablet Take 81 mg by mouth at bedtime.   Calcium Carbonate Antacid (TUMS E-X PO) Take by mouth as needed.   cholecalciferol (VITAMIN D) 1000 UNITS tablet Take 2,000 Units by mouth daily.    donepezil (ARICEPT) 5 MG tablet Take 5 mg by mouth at bedtime.   famotidine (PEPCID) 10 MG tablet Take 10 mg by mouth as needed.   ferrous sulfate 325 (65 FE) MG tablet Take 325 mg by mouth daily with breakfast.   hydrochlorothiazide (HYDRODIURIL) 25 MG tablet Take 25 mg by mouth daily.   lisinopril (ZESTRIL) 40 MG tablet Take 40 mg by mouth daily.   metFORMIN (GLUCOPHAGE) 500 MG tablet TAKE 1 TABLET BY MOUTH TWICE DAILY WITH A MEAL   metoprolol tartrate (LOPRESSOR) 100 MG tablet Take 1 tablet (100 mg total) by mouth once for  1 dose. Take 90-120 minutes prior to scan.   Multiple Vitamins-Minerals (MULTIPLE VITAMINS/WOMENS PO) Take 1 tablet by mouth daily.   naproxen sodium (ANAPROX) 220 MG tablet Take 440 mg by mouth daily as needed (back pain).    Omega-3 Fatty Acids (FISH OIL) 1000 MG CAPS Take 2 capsules (2,000 mg total) by mouth daily.   potassium chloride SA (K-DUR,KLOR-CON) 20 MEQ tablet TAKE 1 TABLET(20 MEQ) BY MOUTH DAILY   simvastatin (ZOCOR) 40 MG tablet Take 1 tablet (40 mg total) by mouth at bedtime.   vitamin B-12 (CYANOCOBALAMIN) 1000 MCG tablet Take 1,000 mcg by mouth daily.     Allergies:   Patient has no known allergies.   Social History   Socioeconomic History   Marital status: Divorced    Spouse name: Not on file   Number of children: 0   Years of education: 12   Highest education level: Not on file  Occupational History    Occupation: retired  Tobacco Use   Smoking status: Former    Packs/day: 0.40    Years: 6.00    Pack years: 2.40    Types: Cigarettes    Quit date: 08/01/1978    Years since quitting: 43.4   Smokeless tobacco: Never  Substance and Sexual Activity   Alcohol use: No    Alcohol/week: 0.0 standard drinks   Drug use: No   Sexual activity: Not on file  Other Topics Concern   Not on file  Social History Narrative   10/16/20 Lives alone in an apartment on the second floor.  10/18/21   Has no children.  Retired Sales promotion account executive.  Education: high school.   Social Determinants of Health   Financial Resource Strain: Not on file  Food Insecurity: Not on file  Transportation Needs: Not on file  Physical Activity: Not on file  Stress: Not on file  Social Connections: Not on file     Family History: The patient's family history includes Diabetes in her brother, brother, maternal grandmother, mother, paternal grandmother, and another family member; Heart attack in her father and mother; Heart disease in her brother, father, and mother; Heart failure in her brother; Hypertension in her brother and father; Stroke in her brother.  ROS:   Please see the history of present illness.    Review of Systems  Constitutional:  Positive for malaise/fatigue. Negative for chills and fever.  Eyes:  Negative for blurred vision.  Respiratory:  Negative for cough, shortness of breath and wheezing.   Cardiovascular:  Negative for chest pain, palpitations, orthopnea, claudication, leg swelling and PND.  Gastrointestinal:  Negative for heartburn, nausea and vomiting.  Genitourinary:  Negative for dysuria and hematuria.  Musculoskeletal:  Positive for joint pain. Negative for falls.  Neurological:  Negative for dizziness, seizures and loss of consciousness.  Psychiatric/Behavioral:  Positive for memory loss.     EKGs/Labs/Other Studies Reviewed:    The following studies were reviewed today:  TTE  12/16/19: IMPRESSIONS   1. Definity used; normal LV systolic function; grade 1 diastolic  dysfunction.   2. Left ventricular ejection fraction, by estimation, is 60 to 65%. The  left ventricle has normal function. The left ventricle has no regional  wall motion abnormalities. Left ventricular diastolic parameters are  consistent with Grade I diastolic  dysfunction (impaired relaxation).   3. Right ventricular systolic function is normal. The right ventricular  size is normal. Tricuspid regurgitation signal is inadequate for assessing  PA pressure.   4. The mitral valve is  normal in structure. Trivial mitral valve  regurgitation. No evidence of mitral stenosis.   5. The aortic valve has an indeterminant number of cusps. Aortic valve  regurgitation is not visualized. No aortic stenosis is present.   EKG:   12/15/2020: NSR, Low voltage, poor R wave progression in precordial leads.  Recent Labs: No results found for requested labs within last 8760 hours.  Recent Lipid Panel    Component Value Date/Time   CHOL 170 06/01/2016 0944   TRIG 236.0 (H) 06/01/2016 0944   HDL 62.50 06/01/2016 0944   CHOLHDL 3 06/01/2016 0944   VLDL 47.2 (H) 06/01/2016 0944   LDLCALC 54 05/22/2014 0937   LDLDIRECT 88.0 06/01/2016 0944      Physical Exam:    VS:  BP 124/70   Pulse (!) 101   Ht '5\' 2"'$  (1.575 m)   Wt 198 lb 9.6 oz (90.1 kg)   SpO2 98%   BMI 36.32 kg/m     Wt Readings from Last 3 Encounters:  12/21/21 198 lb 9.6 oz (90.1 kg)  10/18/21 203 lb (92.1 kg)  12/15/20 202 lb 9.6 oz (91.9 kg)     GEN: Elderly female, comfortable, NAD HEENT: Normal NECK: No JVD; No carotid bruits CARDIAC: RRR, no murmurs, rubs, gallops RESPIRATORY:  Clear to auscultation without rales, wheezing or rhonchi  ABDOMEN: Soft, non-tender, non-distended MUSCULOSKELETAL:  No edema; No deformity. Ambulates with a cane SKIN: Warm and dry NEUROLOGIC:  AAOx3 PSYCHIATRIC:  Normal affect   ASSESSMENT:    1.  Dyspnea on exertion   2. Precordial pain   3. Pure hypercholesterolemia   4. Family history of early CAD   46. Syncope and collapse   6. Essential hypertension   7. Diabetes mellitus with coincident hypertension ([place])   8. Family history of coronary artery disease    PLAN:    In order of problems listed above:  #Exertional Fatigue/DOE: Patient reports worsening dyspnea and fatigue with exertion. She has to stop to catch her breath multiple times when ambulating the hallway to her apartment. No chest pain but given strong family history of CAD as well as risk factors, will check coronary CTA for further work-up. -Check coronary CTA  #Vasovagal Syncope: No recurrence of symptoms. TTE with normal LVEF, no significant valve abnormalities. Maintains good hydration.  -Continue conservative measures -Continue hydration and leg elevation -Compression socks as needed  #HTN: Well controlled and at goal. -Continue amlodipine '5mg'$  daily -Continue lisinopril '40mg'$  faily -Continue HCTZ '25mg'$  daily  #DMII: -Continue metformin '500mg'$  BID  #HLD: -Continue simva '40mg'$  daily -Monitored per PCP  #Family history of CAD: -Continue ASA 93mgdaily -Continue simva '40mg'$  daily as above -Continue lifestyle modifications     Medication Adjustments/Labs and Tests Ordered: Current medicines are reviewed at length with the patient today.  Concerns regarding medicines are outlined above.  Orders Placed This Encounter  Procedures   CT CORONARY MORPH W/CTA COR W/SCORE W/CA W/CM &/OR WO/CM   Basic metabolic panel   EKG 116-BWGY  Meds ordered this encounter  Medications   metoprolol tartrate (LOPRESSOR) 100 MG tablet    Sig: Take 1 tablet (100 mg total) by mouth once for 1 dose. Take 90-120 minutes prior to scan.    Dispense:  1 tablet    Refill:  0    Patient Instructions  Medication Instructions:   Your physician recommends that you continue on your current medications as directed. Please refer  to the Current Medication list given to you  today.  *If you need a refill on your cardiac medications before your next appointment, please call your pharmacy*   Testing/Procedures:    Your cardiac CT will be scheduled at one of the below locations:   [facility] [address] [place], [place] [address] [phone]  If scheduled at [facility], please arrive at the [facility] and Children's Entrance (Entrance C2) of [facility] 30 minutes prior to test start time. You can use the FREE valet parking offered at entrance C (encouraged to control the heart rate for the test)  Proceed to the [facility] Radiology Department (first floor) to check-in and test prep.  All radiology patients and guests should use entrance C2 at [facility], accessed from [facility], even though the hospital's physical address listed is [address].     Please follow these instructions carefully (unless otherwise directed):   On the Night Before the Test: Be sure to Drink plenty of water. Do not consume any caffeinated/decaffeinated beverages or chocolate 12 hours prior to your test. Do not take any antihistamines 12 hours prior to your test.  On the Day of the Test: Drink plenty of water until 1 hour prior to the test. Do not eat any food 4 hours prior to the test. You may take your regular medications prior to the test.  Take metoprolol 100 mg by mouth (Lopressor) two hours prior to test. HOLD Hydrochlorothiazide morning of the test. FEMALES- please wear underwire-free bra if available, avoid dresses & tight clothing       After the Test: Drink plenty of water. After receiving IV contrast, you may experience a mild flushed feeling. This is normal. On occasion, you may experience a mild rash up to 24 hours after the test. This is not dangerous. If this occurs, you can take Benadryl 25 mg and increase your fluid intake. If you experience  trouble breathing, this can be serious. If it is severe call 911 IMMEDIATELY. If it is mild, please call our office. If you take any of these medications: Glipizide/Metformin, Avandament, Glucavance, please do not take 48 hours after completing test unless otherwise instructed.  We will call to schedule your test 2-4 weeks out understanding that some insurance companies will need an authorization prior to the service being performed.   For non-scheduling related questions, please contact the cardiac imaging nurse navigator should you have any questions/concerns: Marchia Bond, Cardiac Imaging Nurse Navigator Gordy Clement, Cardiac Imaging Nurse Navigator [facility] Direct Office Dial: [phone]   For scheduling needs, including cancellations and rescheduling, please call [place], [phone].    Follow-Up: At [facility], you and your health needs are our priority.  As part of our continuing mission to provide you with exceptional heart care, we have created designated Provider Care Teams.  These Care Teams include your primary Cardiologist (physician) and Advanced Practice Providers (APPs -  Physician Assistants and Nurse Practitioners) who all work together to provide you with the care you need, when you need it.  We recommend signing up for the patient portal called "MyChart".  Sign up information is provided on this After Visit Summary.  MyChart is used to connect with patients for Virtual Visits (Telemedicine).  Patients are able to view lab/test results, encounter notes, upcoming appointments, etc.  Non-urgent messages can be sent to your provider as well.   To learn more about what you can do with MyChart, go to [email or web address].  Your next appointment:   6 month(s)  The format for your next appointment:   In Person  Provider:   Dr. Johney Frame   Important Information About Sugar         Signed, Freada Bergeron, MD   12/21/2021 9:21 AM    [place]

## 2021-12-20 ENCOUNTER — Ambulatory Visit: Payer: Medicare Other | Admitting: Cardiology

## 2021-12-21 ENCOUNTER — Encounter: Payer: Self-pay | Admitting: Cardiology

## 2021-12-21 ENCOUNTER — Ambulatory Visit: Payer: Medicare Other | Admitting: Cardiology

## 2021-12-21 VITALS — BP 124/70 | HR 101 | Ht 62.0 in | Wt 198.6 lb

## 2021-12-21 DIAGNOSIS — E7800 Pure hypercholesterolemia, unspecified: Secondary | ICD-10-CM

## 2021-12-21 DIAGNOSIS — R55 Syncope and collapse: Secondary | ICD-10-CM

## 2021-12-21 DIAGNOSIS — R0609 Other forms of dyspnea: Secondary | ICD-10-CM

## 2021-12-21 DIAGNOSIS — R072 Precordial pain: Secondary | ICD-10-CM

## 2021-12-21 DIAGNOSIS — Z8249 Family history of ischemic heart disease and other diseases of the circulatory system: Secondary | ICD-10-CM

## 2021-12-21 DIAGNOSIS — I10 Essential (primary) hypertension: Secondary | ICD-10-CM

## 2021-12-21 DIAGNOSIS — E119 Type 2 diabetes mellitus without complications: Secondary | ICD-10-CM

## 2021-12-21 MED ORDER — METOPROLOL TARTRATE 100 MG PO TABS: 100.0000 mg | ORAL_TABLET | Freq: Once | ORAL | 0 refills | Status: DC

## 2021-12-21 NOTE — Patient Instructions (Signed)
Medication Instructions:   Your physician recommends that you continue on your current medications as directed. Please refer to the Current Medication list given to you today.  *If you need a refill on your cardiac medications before your next appointment, please call your pharmacy*   Testing/Procedures:    Your cardiac CT will be scheduled at one of the below locations:   [facility] [address] [place], [place] [address] [phone]  If scheduled at [facility], please arrive at the [facility] and Children's Entrance (Entrance C2) of [facility] 30 minutes prior to test start time. You can use the FREE valet parking offered at entrance C (encouraged to control the heart rate for the test)  Proceed to the [facility] Radiology Department (first floor) to check-in and test prep.  All radiology patients and guests should use entrance C2 at [facility], accessed from [facility], even though the hospital's physical address listed is [address]     Please follow these instructions carefully (unless otherwise directed):   On the Night Before the Test: Be sure to Drink plenty of water. Do not consume any caffeinated/decaffeinated beverages or chocolate 12 hours prior to your test. Do not take any antihistamines 12 hours prior to your test.  On the Day of the Test: Drink plenty of water until 1 hour prior to the test. Do not eat any food 4 hours prior to the test. You may take your regular medications prior to the test.  Take metoprolol 100 mg by mouth (Lopressor) two hours prior to test. HOLD Hydrochlorothiazide morning of the test. FEMALES- please wear underwire-free bra if available, avoid dresses & tight clothing       After the Test: Drink plenty of water. After receiving IV contrast, you may experience a mild flushed feeling. This is normal. On occasion, you may experience a mild rash up to 24 hours after  the test. This is not dangerous. If this occurs, you can take Benadryl 25 mg and increase your fluid intake. If you experience trouble breathing, this can be serious. If it is severe call 911 IMMEDIATELY. If it is mild, please call our office. If you take any of these medications: Glipizide/Metformin, Avandament, Glucavance, please do not take 48 hours after completing test unless otherwise instructed.  We will call to schedule your test 2-4 weeks out understanding that some insurance companies will need an authorization prior to the service being performed.   For non-scheduling related questions, please contact the cardiac imaging nurse navigator should you have any questions/concerns: Marchia Bond, Cardiac Imaging Nurse Navigator Gordy Clement, Cardiac Imaging Nurse Navigator [facility] Direct Office Dial: [phone]   For scheduling needs, including cancellations and rescheduling, please call [place], [phone].    Follow-Up: At [facility], you and your health needs are our priority.  As part of our continuing mission to provide you with exceptional heart care, we have created designated Provider Care Teams.  These Care Teams include your primary Cardiologist (physician) and Advanced Practice Providers (APPs -  Physician Assistants and Nurse Practitioners) who all work together to provide you with the care you need, when you need it.  We recommend signing up for the patient portal called "MyChart".  Sign up information is provided on this After Visit Summary.  MyChart is used to connect with patients for Virtual Visits (Telemedicine).  Patients are able to view lab/test results, encounter notes, upcoming appointments, etc.  Non-urgent messages can be sent to your provider as well.   To learn more about what you can do with MyChart, go to [email or web address].    Your next appointment:   6 month(s)  The format for your next appointment:   In  Person  Provider:   Dr. Johney Frame   Important Information About [place]

## 2021-12-24 ENCOUNTER — Telehealth: Payer: Self-pay | Admitting: *Deleted

## 2021-12-24 NOTE — Telephone Encounter (Signed)
-----   Message from Melony Overly sent at 12/23/2021 12:06 PM EDT ----- Regarding: ct heart Scheduled 01/10/22 at 3:30

## 2022-01-07 ENCOUNTER — Encounter (HOSPITAL_COMMUNITY): Payer: Self-pay

## 2022-01-07 ENCOUNTER — Other Ambulatory Visit (HOSPITAL_COMMUNITY): Payer: Self-pay | Admitting: *Deleted

## 2022-01-07 ENCOUNTER — Other Ambulatory Visit: Payer: Medicare Other

## 2022-01-07 ENCOUNTER — Telehealth (HOSPITAL_COMMUNITY): Payer: Self-pay | Admitting: *Deleted

## 2022-01-07 DIAGNOSIS — R072 Precordial pain: Secondary | ICD-10-CM

## 2022-01-07 DIAGNOSIS — Z8249 Family history of ischemic heart disease and other diseases of the circulatory system: Secondary | ICD-10-CM

## 2022-01-07 DIAGNOSIS — E7800 Pure hypercholesterolemia, unspecified: Secondary | ICD-10-CM

## 2022-01-07 LAB — BASIC METABOLIC PANEL
BUN/Creatinine Ratio: 10 — ABNORMAL LOW (ref 12–28)
BUN: 9 mg/dL (ref 8–27)
CO2: 20 mmol/L (ref 20–29)
Calcium: 9.8 mg/dL (ref 8.7–10.3)
Chloride: 89 mmol/L — ABNORMAL LOW (ref 96–106)
Creatinine, Ser: 0.89 mg/dL (ref 0.57–1.00)
Glucose: 207 mg/dL — ABNORMAL HIGH (ref 70–99)
Potassium: 4 mmol/L (ref 3.5–5.2)
Sodium: 132 mmol/L — ABNORMAL LOW (ref 134–144)
eGFR: 68 mL/min/{1.73_m2} (ref >59)

## 2022-01-07 NOTE — Telephone Encounter (Signed)
Reaching out to patient's brother per patient's request to offer assistance regarding upcoming cardiac imaging study; pt's brother verbalizes understanding of appt date/time, parking situation and where to check in, pre-test NPO status and medications ordered, and verified current allergies; name and call back number provided for further questions should they arise  Gordy Clement RN Navigator Cardiac Imaging [facility] [phone] office [phone] cell  Patient to take '100mg'$  metoprolol tartrate two hours prior to her cardiac CT scan.  Her brother is aware that she should arrive at 3pm.

## 2022-01-10 ENCOUNTER — Ambulatory Visit (HOSPITAL_COMMUNITY)
Admission: RE | Admit: 2022-01-10 | Discharge: 2022-01-10 | Disposition: A | Discharge status: Home / Self Care | Payer: Medicare Other | Source: Ambulatory Visit | Attending: Cardiology | Admitting: Cardiology

## 2022-01-10 DIAGNOSIS — E7800 Pure hypercholesterolemia, unspecified: Secondary | ICD-10-CM | POA: Insufficient documentation

## 2022-01-10 DIAGNOSIS — R072 Precordial pain: Secondary | ICD-10-CM

## 2022-01-10 DIAGNOSIS — Z8249 Family history of ischemic heart disease and other diseases of the circulatory system: Secondary | ICD-10-CM | POA: Diagnosis present

## 2022-01-10 MED ORDER — DILTIAZEM HCL 25 MG/5ML IV SOLN
5.0000 mg | INTRAVENOUS | Status: DC | PRN
  Administered 2022-01-10: 5 mg via INTRAVENOUS

## 2022-01-10 MED ORDER — NITROGLYCERIN 0.4 MG SL SUBL: 0.8000 mg | SUBLINGUAL_TABLET | Freq: Once | SUBLINGUAL | Status: AC

## 2022-01-10 MED ORDER — IOHEXOL 350 MG/ML SOLN
100.0000 mL | Freq: Once | INTRAVENOUS | Status: AC | PRN
  Administered 2022-01-10: 100 mL via INTRAVENOUS

## 2022-01-10 MED ORDER — NITROGLYCERIN 0.4 MG SL SUBL
SUBLINGUAL_TABLET | SUBLINGUAL | Status: AC
  Administered 2022-01-10: 0.8 mg via SUBLINGUAL
  Filled 2022-01-10: qty 2

## 2022-01-10 MED ORDER — METOPROLOL TARTRATE 5 MG/5ML IV SOLN: 10.0000 mg | INTRAVENOUS | Status: DC | PRN

## 2022-01-13 ENCOUNTER — Telehealth: Payer: Self-pay | Admitting: *Deleted

## 2022-01-13 DIAGNOSIS — I2510 Atherosclerotic heart disease of native coronary artery without angina pectoris: Secondary | ICD-10-CM

## 2022-01-13 DIAGNOSIS — Z79899 Other long term (current) drug therapy: Secondary | ICD-10-CM

## 2022-01-13 DIAGNOSIS — E785 Hyperlipidemia, unspecified: Secondary | ICD-10-CM

## 2022-01-13 DIAGNOSIS — Z8249 Family history of ischemic heart disease and other diseases of the circulatory system: Secondary | ICD-10-CM

## 2022-01-13 DIAGNOSIS — E7800 Pure hypercholesterolemia, unspecified: Secondary | ICD-10-CM

## 2022-01-13 NOTE — Telephone Encounter (Signed)
The patient has been notified of the result and verbalized understanding.  All questions (if any) were answered.  Pt will come in for repeat lipids at our office on next Tuesday 6/20.  She is aware to come fasting to this lab appt.  Pt verbalized understanding and agrees with this plan.

## 2022-01-13 NOTE — Telephone Encounter (Signed)
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 Her Ca score is 229 which is the 76%. This means we just need to ensure her disease does not get worse with time. Can we get her in for cholesterol check to ensure her LDL is at goal of <70? If she had recent labs with her PCP then we can have that sent over too.

## 2022-01-18 ENCOUNTER — Other Ambulatory Visit: Payer: Medicare Other

## 2022-01-24 ENCOUNTER — Other Ambulatory Visit: Payer: Medicare Other

## 2022-01-24 DIAGNOSIS — Z8249 Family history of ischemic heart disease and other diseases of the circulatory system: Secondary | ICD-10-CM

## 2022-01-24 DIAGNOSIS — E7800 Pure hypercholesterolemia, unspecified: Secondary | ICD-10-CM

## 2022-01-24 DIAGNOSIS — I2510 Atherosclerotic heart disease of native coronary artery without angina pectoris: Secondary | ICD-10-CM

## 2022-01-24 DIAGNOSIS — E785 Hyperlipidemia, unspecified: Secondary | ICD-10-CM

## 2022-01-24 DIAGNOSIS — Z79899 Other long term (current) drug therapy: Secondary | ICD-10-CM

## 2022-01-24 LAB — LIPID PANEL
Chol/HDL Ratio: 2.3 ratio (ref 0.0–4.4)
Cholesterol, Total: 154 mg/dL (ref 100–199)
HDL: 66 mg/dL (ref >39)
LDL Chol Calc (NIH): 55 mg/dL (ref 0–99)
Triglycerides: 206 mg/dL — ABNORMAL HIGH (ref 0–149)
VLDL Cholesterol Cal: 33 mg/dL (ref 5–40)

## 2022-01-25 ENCOUNTER — Telehealth: Payer: Self-pay | Admitting: *Deleted

## 2022-01-25 DIAGNOSIS — Z79899 Other long term (current) drug therapy: Secondary | ICD-10-CM

## 2022-01-25 DIAGNOSIS — E7800 Pure hypercholesterolemia, unspecified: Secondary | ICD-10-CM

## 2022-01-25 DIAGNOSIS — Z8249 Family history of ischemic heart disease and other diseases of the circulatory system: Secondary | ICD-10-CM

## 2022-01-25 DIAGNOSIS — E785 Hyperlipidemia, unspecified: Secondary | ICD-10-CM

## 2022-01-25 DIAGNOSIS — I2510 Atherosclerotic heart disease of native coronary artery without angina pectoris: Secondary | ICD-10-CM

## 2022-01-25 MED ORDER — ICOSAPENT ETHYL 1 G PO CAPS: 2.0000 g | ORAL_CAPSULE | Freq: Two times a day (BID) | ORAL | 6 refills | Status: DC

## 2022-01-25 NOTE — Telephone Encounter (Signed)
The patient has been notified of the result and verbalized understanding.  All questions (if any) were answered.  Pt states she would like to start taking vascepa 2 grams po bid.   Confirmed the pharmacy of choice with the pt.  Pt will have lipids redrawn same day she comes to see Dr. Shari Prows for follow-up on 11/20.  She is aware to come fasting to this lab appt.  Pt verbalized understanding and agrees with this plan.

## 2022-04-19 ENCOUNTER — Encounter: Payer: Self-pay | Admitting: Cardiology

## 2022-06-20 ENCOUNTER — Other Ambulatory Visit: Payer: Medicare Other

## 2022-06-20 ENCOUNTER — Ambulatory Visit: Payer: Medicare Other | Admitting: Cardiology

## 2022-07-15 ENCOUNTER — Telehealth: Payer: Self-pay | Admitting: Licensed Clinical Social Worker

## 2022-07-15 NOTE — Patient Outreach (Signed)
  Care Coordination   07/15/2022 Name: Cynthia [name] MRN: [id number] DOB: [birth year]   Care Coordination Outreach Attempts:  An unsuccessful telephone outreach was attempted today to offer the patient information about available care coordination services as a benefit of their health plan.   Follow Up Plan:  Additional outreach attempts will be made to offer the patient care coordination information and services.   Encounter Outcome:  No Answer   Care Coordination Interventions:  No, not indicated    Christa See, MSW, [email or web address] Phone [phone] 4:23 PM

## 2022-07-18 ENCOUNTER — Telehealth: Payer: Self-pay | Admitting: *Deleted

## 2022-07-18 NOTE — Progress Notes (Signed)
  Care Coordination   Note   07/18/2022 Name: Cynthia [name] MRN: [id number] DOB: 09-07-1945  Cynthia [name] is a 76 y.o. year old female who sees Velna Hatchet, MD for primary care. I reached out to [name] by phone today to offer care coordination services.  Ms. Minardi was given information about Care Coordination services today including:   The Care Coordination services include support from the care team which includes your Nurse Coordinator, Clinical Social Worker, or Pharmacist.  The Care Coordination team is here to help remove barriers to the health concerns and goals most important to you. Care Coordination services are voluntary, and the patient may decline or stop services at any time by request to their care team member.   Care Coordination Consent Status: Patient agreed to services and verbal consent obtained.   Follow up plan:  Telephone appointment with care coordination team member scheduled for:  07/19/22  Encounter Outcome:  Pt. Scheduled  [place]  Direct Dial: [phone]

## 2022-07-19 ENCOUNTER — Ambulatory Visit: Payer: Self-pay | Admitting: Licensed Clinical Social Worker

## 2022-07-22 NOTE — Patient Instructions (Signed)
Visit Information  Thank you for taking time to visit with me today. Please don't hesitate to contact me if I can be of assistance to you.   Following are the goals we discussed today:   Goals Addressed             This Visit's Progress    COMPLETED: Care Coordination Activities-No Follow Up Required       Care Coordination Interventions: Solution-Focused Strategies employed:  Active listening / Reflection utilized  Emotional Support Provided Verbalization of feelings encouraged  Patient has resided at current residence "for years" Patient shared that her unit is disability accessible and she gets along with staff and residents there Patient receives strong support from brother and sister in law, who assists with pt's weekly pill boxes every 3-4 weeks and transportation Pt prepares her own meals and orders groceries from [place] to be delivered Patient reports that she completes ADL's without assistance. Utilizes walker  Patient denies having any stressors or mental health needs Patient provided verbal consent for LCSW to speak with her brother. LCSW has made multiple attempts to speak with pt's brother and has left voice messages requesting a return call        Please call the care guide team at [phone] if you need to cancel or reschedule your appointment.   If you are experiencing a Mental Health or [place] or need someone to talk to, please call the Suicide and Crisis Lifeline: 988 call 911   Patient verbalizes understanding of instructions and care plan provided today and agrees to view in [place]. Active MyChart status and patient understanding of how to access instructions and care plan via MyChart confirmed with patient.     No further follow up required:    Christa See, MSW, [email or web address] Phone [phone] 7:20 AM

## 2022-07-22 NOTE — Patient Outreach (Signed)
  Care Coordination Late Documentation  Initial Visit Note   Outreach completed 07/19/22 Name: Cynthia [name] MRN: [id number] DOB: 12/02/1945  Cynthia [name] is a 76 y.o. year old female who sees Velna Hatchet, MD for primary care. I spoke with  Cynthia [name] today.  What matters to the patients health and wellness today?  Patient shared she is not in need of additional support services    Goals Addressed             This Visit's Progress    COMPLETED: Care Coordination Activities-No Follow Up Required       Care Coordination Interventions: Solution-Focused Strategies employed:  Active listening / Reflection utilized  Emotional Support Provided Verbalization of feelings encouraged  Patient has resided at current residence "for years" Patient shared that her unit is disability accessible and she gets along with staff and residents there Patient receives strong support from brother and sister in law, who assists with pt's weekly pill boxes every 3-4 weeks and transportation Pt prepares her own meals and orders groceries from [place] to be delivered Patient reports that she completes ADL's without assistance. Utilizes walker  Patient denies having any stressors or mental health needs Patient provided verbal consent for LCSW to speak with her brother. LCSW has made multiple attempts to speak with pt's brother and has left voice messages requesting a return call         SDOH assessments and interventions completed:  Yes  SDOH Interventions Today    Flowsheet Row Most Recent Value  SDOH Interventions   Food Insecurity Interventions Intervention Not Indicated  Housing Interventions Intervention Not Indicated  Transportation Interventions Intervention Not Indicated        Care Coordination Interventions:  Yes, provided   Follow up plan: No further intervention required.   Encounter Outcome:  Pt. Visit Completed   Christa See, MSW, [email or web address] Phone [phone] 7:20 AM

## 2022-07-29 ENCOUNTER — Telehealth: Payer: Self-pay | Admitting: Licensed Clinical Social Worker

## 2022-08-05 ENCOUNTER — Telehealth: Payer: Self-pay | Admitting: Licensed Clinical Social Worker

## 2022-08-05 NOTE — Patient Instructions (Signed)
Visit Information  Thank you for taking time to visit with me today. Please don't hesitate to contact me if I can be of assistance to you.   Following are the goals we discussed today:   Goals Addressed             This Visit's Progress    COMPLETED: Care Coordination Activities-No Follow Up Required       Care Coordination Interventions: Solution-Focused Strategies employed:  Active listening / Reflection utilized  Emotional Support Provided Verbalization of feelings encouraged  Patient has resided at current residence "for years" Patient shared that her unit is disability accessible and she gets along with staff and residents there Patient receives strong support from brother and sister in law, who assists with pt's weekly pill boxes every 3-4 weeks and transportation Pt prepares her own meals and orders groceries from [place] to be delivered Patient reports that she completes ADL's without assistance. Utilizes walker  Patient denies having any stressors or mental health needs Patient provided verbal consent for LCSW to speak with her brother. LCSW has made multiple attempts to speak with pt's brother and has left voice messages requesting a return call       Supportive Resources-LCSW   On track    Care Coordination Interventions: Solution-Focused Strategies employed:  Active listening / Reflection utilized  Emotional Support Provided Caregiver stress acknowledged  Verbalization of feelings encouraged  LCSW spoke with Shanon Brow and spouse, Inez Catalina Family shared that there may not be an immediate need for pt to be placed; however, endorsed ongoing concerns about pt becoming weaker resulting in multiple falls, in addition, to memory concerns Family is interested in information regarding level of care. LCSW discussed various options and agreed to provide resources Pt is in donut hole. Obtains social security and has minimal assets. Not eligible for medicaid No concerns for mental  health needs Pt is unable to navigate stairs at family's residence LCSW will provide listing of Assisted Living/Long Term Care Facilities/DSS Family has contacted [facility]; however, pt is not receptive to [facility] services. Will address with PCP at upcoming appt LCSW reviewed upcoming appts           Our next appointment is by telephone on 09/09/22 at 1 PM  Please call the care guide team at [phone] if you need to cancel or reschedule your appointment.   If you are experiencing a Mental Health or [place] or need someone to talk to, please call 911   Patient verbalizes understanding of instructions and care plan provided today and agrees to view in [place]. Active MyChart status and patient understanding of how to access instructions and care plan via MyChart confirmed with patient.     Christa See, MSW, [email or web address] Phone [phone] 8:49 PM

## 2022-08-05 NOTE — Patient Outreach (Signed)
  Care Coordination Late Entry  Follow Up Visit Note   Outreach completed 07/29/22 Name: Cynthia [name] MRN: [id number] DOB: 07-25-1946  Cynthia [name] is a 77 y.o. year old female who sees Cynthia Hatchet, MD for primary care. I spoke with  Cynthia [name]'s [name] and sister in [name] today.  What matters to the patients health and wellness today?  Levels of Care/Resources    Goals Addressed             This Visit's Progress    COMPLETED: Care Coordination Activities-No Follow Up Required       Care Coordination Interventions: Solution-Focused Strategies employed:  Active listening / Reflection utilized  Emotional Support Provided Verbalization of feelings encouraged  Patient has resided at current residence "for years" Patient shared that her unit is disability accessible and she gets along with staff and residents there Patient receives strong support from [name] and sister in [name], who assists with pt's weekly pill boxes every 3-4 weeks and transportation Pt prepares her own meals and orders groceries from [place] to be delivered Patient reports that she completes ADL's without assistance. Utilizes walker  Patient denies having any stressors or mental health needs Patient provided verbal consent for LCSW to speak with her [name]. LCSW has made multiple attempts to speak with pt's [name] and has left voice messages requesting a return call       Supportive Resources-LCSW   On track    Care Coordination Interventions: Solution-Focused Strategies employed:  Active listening / Reflection utilized  Emotional Support Provided Caregiver stress acknowledged  Verbalization of feelings encouraged  LCSW spoke with Cynthia [name] and spouse, Cynthia [name] Family shared that there may not be an immediate need for pt to be placed; however, endorsed ongoing concerns about pt becoming weaker resulting in multiple falls, in addition, to memory concerns Family is interested in information regarding level  of care. LCSW discussed various options and agreed to provide resources Pt is in donut hole. Obtains social security and has minimal assets. Not eligible for medicaid No concerns for mental health needs Pt is unable to navigate stairs at family's residence LCSW will provide listing of Assisted Living/Long Term Care Facilities/DSS Family has contacted [facility]; however, pt is not receptive to [place] services. Will address with PCP at upcoming appt LCSW reviewed upcoming appts           SDOH assessments and interventions completed:  No     Care Coordination Interventions:  Yes, provided   Follow up plan: Follow up call scheduled for 4-6 weeks    Encounter Outcome:  Pt. Visit Completed   Christa See, MSW, [email or web address] Phone [phone] 8:48 PM

## 2022-08-09 NOTE — Patient Outreach (Signed)
  Care Coordination Late Entry  Follow Up Visit Note   Outreach completed 08/05/22 Name: CONNEE IKNER MRN: [id number] DOB: 1945/08/12  ORIANNA BISKUP is a 77 y.o. year old female who sees Velna Hatchet, MD for primary care. I spoke with  Cresenciano Lick Dechellis's brother, Shanon Brow, by phone today.  What matters to the patients health and wellness today?  Supportive Resources    Goals Addressed             This Visit's Progress    Supportive Resources-LCSW   On track    Care Coordination Interventions: Solution-Focused Strategies employed:  Active listening / Reflection utilized  Emotional Support Provided Caregiver stress acknowledged  Verbalization of feelings encouraged  LCSW spoke with Erling Conte pt is doing well this week Shanon Brow shared that he did not receive supportive resources via e-mail. LCSW apologized about the delay and sent resources to email. Confirmation of receipt received Family will review resources and will discuss with LCSW at upcoming appt No additional concern/questions noted           SDOH assessments and interventions completed:  No     Care Coordination Interventions:  Yes, provided   Follow up plan: Follow up call scheduled for 2-4 weeks    Encounter Outcome:  Pt. Visit Completed   Christa See, MSW, [email or web address] Phone [phone] 3:37 PM

## 2022-08-09 NOTE — Patient Instructions (Signed)
Visit Information  Thank you for taking time to visit with me today. Please don't hesitate to contact me if I can be of assistance to you.   Following are the goals we discussed today:   Goals Addressed             This Visit's Progress    Supportive Resources-LCSW   On track    Care Coordination Interventions: Solution-Focused Strategies employed:  Active listening / Reflection utilized  Emotional Support Provided Caregiver stress acknowledged  Verbalization of feelings encouraged  LCSW spoke with Erling Conte pt is doing well this week Shanon Brow shared that he did not receive supportive resources via e-mail. LCSW apologized about the delay and sent resources to email. Confirmation of receipt received Family will review resources and will discuss with LCSW at upcoming appt No additional concern/questions noted           Our next appointment is by telephone on 09/09/22 at 1 PM  Please call the care guide team at [phone] if you need to cancel or reschedule your appointment.   If you are experiencing a Mental Health or [place] or need someone to talk to, please call the Suicide and Crisis Lifeline: 988 call 911   Patient verbalizes understanding of instructions and care plan provided today and agrees to view in [place]. Active MyChart status and patient understanding of how to access instructions and care plan via MyChart confirmed with patient.     Christa See, MSW, [email or web address] Phone [phone] 3:38 PM

## 2022-08-15 ENCOUNTER — Encounter: Payer: Self-pay | Admitting: Internal Medicine

## 2022-09-09 ENCOUNTER — Encounter: Payer: Medicare Other | Admitting: Licensed Clinical Social Worker

## 2022-10-14 ENCOUNTER — Encounter: Payer: Self-pay | Admitting: Licensed Clinical Social Worker

## 2022-10-14 ENCOUNTER — Telehealth: Payer: Self-pay | Admitting: Licensed Clinical Social Worker

## 2022-10-14 NOTE — Patient Outreach (Signed)
  Care Coordination   10/14/2022 Name: Cynthia [name] MRN: [id number] DOB: [birth year]   Care Coordination Outreach Attempts:  An unsuccessful telephone outreach was attempted for a scheduled appointment today.  Follow Up Plan:  Additional outreach attempts will be made to offer the patient care coordination information and services.   Encounter Outcome:  No Answer   Care Coordination Interventions:  No, not indicated    Christa See, MSW, [email or web address] Phone [phone] 2:39 PM

## 2024-04-03 ENCOUNTER — Emergency Department (HOSPITAL_COMMUNITY)

## 2024-04-03 ENCOUNTER — Observation Stay (HOSPITAL_COMMUNITY)
Admission: EM | Admit: 2024-04-03 | Discharge: 2024-04-05 | Disposition: A | Discharge status: Skilled Nursing Facility | Attending: Hospitalist | Admitting: Hospitalist

## 2024-04-03 ENCOUNTER — Encounter (HOSPITAL_COMMUNITY): Payer: Self-pay

## 2024-04-03 ENCOUNTER — Other Ambulatory Visit: Payer: Self-pay

## 2024-04-03 ENCOUNTER — Observation Stay (HOSPITAL_COMMUNITY)

## 2024-04-03 ENCOUNTER — Observation Stay (HOSPITAL_BASED_OUTPATIENT_CLINIC_OR_DEPARTMENT_OTHER)

## 2024-04-03 DIAGNOSIS — R2681 Unsteadiness on feet: Secondary | ICD-10-CM | POA: Diagnosis not present

## 2024-04-03 DIAGNOSIS — F0390 Unspecified dementia without behavioral disturbance: Secondary | ICD-10-CM | POA: Insufficient documentation

## 2024-04-03 DIAGNOSIS — D649 Anemia, unspecified: Secondary | ICD-10-CM | POA: Insufficient documentation

## 2024-04-03 DIAGNOSIS — E66812 Obesity, class 2: Secondary | ICD-10-CM | POA: Insufficient documentation

## 2024-04-03 DIAGNOSIS — Z6836 Body mass index (BMI) 36.0-36.9, adult: Secondary | ICD-10-CM | POA: Insufficient documentation

## 2024-04-03 DIAGNOSIS — M6281 Muscle weakness (generalized): Secondary | ICD-10-CM | POA: Diagnosis not present

## 2024-04-03 DIAGNOSIS — I10 Essential (primary) hypertension: Secondary | ICD-10-CM | POA: Insufficient documentation

## 2024-04-03 DIAGNOSIS — K029 Dental caries, unspecified: Secondary | ICD-10-CM | POA: Diagnosis not present

## 2024-04-03 DIAGNOSIS — Z7982 Long term (current) use of aspirin: Secondary | ICD-10-CM | POA: Diagnosis not present

## 2024-04-03 DIAGNOSIS — Z79899 Other long term (current) drug therapy: Secondary | ICD-10-CM | POA: Diagnosis not present

## 2024-04-03 DIAGNOSIS — W19XXXA Unspecified fall, initial encounter: Secondary | ICD-10-CM | POA: Diagnosis present

## 2024-04-03 DIAGNOSIS — R55 Syncope and collapse: Principal | ICD-10-CM | POA: Insufficient documentation

## 2024-04-03 DIAGNOSIS — E119 Type 2 diabetes mellitus without complications: Secondary | ICD-10-CM | POA: Insufficient documentation

## 2024-04-03 DIAGNOSIS — S0993XA Unspecified injury of face, initial encounter: Secondary | ICD-10-CM

## 2024-04-03 LAB — CBC
HCT: 34.4 % — ABNORMAL LOW (ref 36.0–46.0)
Hemoglobin: 11.6 g/dL — ABNORMAL LOW (ref 12.0–15.0)
MCH: 32 pg (ref 26.0–34.0)
MCHC: 33.7 g/dL (ref 30.0–36.0)
MCV: 94.8 fL (ref 80.0–100.0)
Platelets: 184 K/uL (ref 150–400)
RBC: 3.63 MIL/uL — ABNORMAL LOW (ref 3.87–5.11)
RDW: 12.8 % (ref 11.5–15.5)
WBC: 7.7 K/uL (ref 4.0–10.5)
nRBC: 0 % (ref 0.0–0.2)

## 2024-04-03 LAB — ETHANOL: Alcohol, Ethyl (B): <15 mg/dL (ref <15)

## 2024-04-03 LAB — TROPONIN I (HIGH SENSITIVITY)
Troponin I (High Sensitivity): 9 ng/L (ref <18)
Troponin I (High Sensitivity): 13 ng/L (ref <18)

## 2024-04-03 LAB — COMPREHENSIVE METABOLIC PANEL WITH GFR
ALT: 23 U/L (ref 0–44)
AST: 29 U/L (ref 15–41)
Albumin: 3.5 g/dL (ref 3.5–5.0)
Alkaline Phosphatase: 40 U/L (ref 38–126)
Anion gap: 15 (ref 5–15)
BUN: 19 mg/dL (ref 8–23)
CO2: 23 mmol/L (ref 22–32)
Calcium: 9 mg/dL (ref 8.9–10.3)
Chloride: 97 mmol/L — ABNORMAL LOW (ref 98–111)
Creatinine, Ser: 1 mg/dL (ref 0.44–1.00)
GFR, Estimated: 58 mL/min — ABNORMAL LOW (ref >60)
Glucose, Bld: 159 mg/dL — ABNORMAL HIGH (ref 70–99)
Potassium: 3.6 mmol/L (ref 3.5–5.1)
Sodium: 135 mmol/L (ref 135–145)
Total Bilirubin: 0.9 mg/dL (ref 0.0–1.2)
Total Protein: 6 g/dL — ABNORMAL LOW (ref 6.5–8.1)

## 2024-04-03 LAB — ECHOCARDIOGRAM COMPLETE
AR max vel: 1.6 cm2
AV Area VTI: 1.59 cm2
AV Area mean vel: 1.58 cm2
AV Mean grad: 6 mmHg
AV Peak grad: 12 mmHg
Ao pk vel: 1.73 m/s
Area-P 1/2: 2.9 cm2
Height: 62 in
S' Lateral: 2.6 cm
Weight: 3178.15 [oz_av]

## 2024-04-03 LAB — I-STAT CG4 LACTIC ACID, ED: Lactic Acid, Venous: 1.8 mmol/L (ref 0.5–1.9)

## 2024-04-03 LAB — SAMPLE TO BLOOD BANK

## 2024-04-03 LAB — GLUCOSE, CAPILLARY
Glucose-Capillary: 139 mg/dL — ABNORMAL HIGH (ref 70–99)
Glucose-Capillary: 121 mg/dL — ABNORMAL HIGH (ref 70–99)

## 2024-04-03 LAB — PROTIME-INR
INR: 0.9 (ref 0.8–1.2)
Prothrombin Time: 13.2 s (ref 11.4–15.2)

## 2024-04-03 LAB — URINALYSIS, ROUTINE W REFLEX MICROSCOPIC
Bilirubin Urine: NEGATIVE
Glucose, UA: NEGATIVE mg/dL
Hgb urine dipstick: NEGATIVE
Ketones, ur: 5 mg/dL — AB
Leukocytes,Ua: NEGATIVE
Nitrite: NEGATIVE
Protein, ur: NEGATIVE mg/dL
Specific Gravity, Urine: 1.005 (ref 1.005–1.030)
pH: 5 (ref 5.0–8.0)

## 2024-04-03 LAB — HEMOGLOBIN AND HEMATOCRIT, BLOOD
HCT: 35.1 % — ABNORMAL LOW (ref 36.0–46.0)
Hemoglobin: 12 g/dL (ref 12.0–15.0)

## 2024-04-03 LAB — I-STAT CHEM 8, ED
BUN: 21 mg/dL (ref 8–23)
Calcium, Ion: 1.1 mmol/L — ABNORMAL LOW (ref 1.15–1.40)
Chloride: 98 mmol/L (ref 98–111)
Creatinine, Ser: 1.1 mg/dL — ABNORMAL HIGH (ref 0.44–1.00)
Glucose, Bld: 158 mg/dL — ABNORMAL HIGH (ref 70–99)
HCT: 31 % — ABNORMAL LOW (ref 36.0–46.0)
Hemoglobin: 10.5 g/dL — ABNORMAL LOW (ref 12.0–15.0)
Potassium: 3.6 mmol/L (ref 3.5–5.1)
Sodium: 133 mmol/L — ABNORMAL LOW (ref 135–145)
TCO2: 24 mmol/L (ref 22–32)

## 2024-04-03 LAB — TSH: TSH: 0.415 u[IU]/mL (ref 0.350–4.500)

## 2024-04-03 MED ORDER — METOPROLOL TARTRATE 50 MG PO TABS: 100.0000 mg | ORAL_TABLET | Freq: Once | ORAL | Status: DC

## 2024-04-03 MED ORDER — ONDANSETRON HCL 4 MG PO TABS
4.0000 mg | ORAL_TABLET | Freq: Four times a day (QID) | ORAL | Status: DC | PRN
Start: 2024-04-03 — End: 2024-04-05

## 2024-04-03 MED ORDER — INSULIN ASPART 100 UNIT/ML IJ SOLN
0.0000 [IU] | Freq: Three times a day (TID) | INTRAMUSCULAR | Status: DC
  Administered 2024-04-04: 2 [IU] via SUBCUTANEOUS
  Administered 2024-04-05: 1 [IU] via SUBCUTANEOUS

## 2024-04-03 MED ORDER — AMLODIPINE BESYLATE 5 MG PO TABS
5.0000 mg | ORAL_TABLET | Freq: Every day | ORAL | Status: DC
  Administered 2024-04-03 – 2024-04-05 (×3): 5 mg via ORAL
  Filled 2024-04-03 (×3): qty 1

## 2024-04-03 MED ORDER — PERFLUTREN LIPID MICROSPHERE
1.0000 mL | INTRAVENOUS | Status: AC | PRN
  Administered 2024-04-03: 5 mL via INTRAVENOUS

## 2024-04-03 MED ORDER — ENOXAPARIN SODIUM 40 MG/0.4ML IJ SOSY
40.0000 mg | PREFILLED_SYRINGE | INTRAMUSCULAR | Status: DC
  Administered 2024-04-03 – 2024-04-04 (×2): 40 mg via SUBCUTANEOUS
  Filled 2024-04-03 (×2): qty 0.4

## 2024-04-03 MED ORDER — ONDANSETRON HCL 4 MG/2ML IJ SOLN: 4.0000 mg | Freq: Four times a day (QID) | INTRAMUSCULAR | Status: DC | PRN

## 2024-04-03 MED ORDER — DONEPEZIL HCL 10 MG PO TABS
5.0000 mg | ORAL_TABLET | Freq: Every day | ORAL | Status: DC
  Administered 2024-04-03 – 2024-04-04 (×2): 5 mg via ORAL
  Filled 2024-04-03 (×2): qty 1

## 2024-04-03 MED ORDER — ACETAMINOPHEN 325 MG PO TABS
650.0000 mg | ORAL_TABLET | Freq: Four times a day (QID) | ORAL | Status: DC | PRN
  Administered 2024-04-03: 650 mg via ORAL
  Filled 2024-04-03 (×2): qty 2

## 2024-04-03 MED ORDER — FAMOTIDINE 20 MG PO TABS: 10.0000 mg | ORAL_TABLET | ORAL | Status: DC | PRN

## 2024-04-03 MED ORDER — ALBUTEROL SULFATE (2.5 MG/3ML) 0.083% IN NEBU: 2.5000 mg | INHALATION_SOLUTION | Freq: Four times a day (QID) | RESPIRATORY_TRACT | Status: DC | PRN

## 2024-04-03 MED ORDER — LISINOPRIL 20 MG PO TABS
40.0000 mg | ORAL_TABLET | Freq: Every day | ORAL | Status: DC
Start: 2024-04-03 — End: 2024-04-05
  Administered 2024-04-03 – 2024-04-05 (×3): 40 mg via ORAL
  Filled 2024-04-03 (×3): qty 2

## 2024-04-03 MED ORDER — SODIUM CHLORIDE 0.9% FLUSH
3.0000 mL | Freq: Two times a day (BID) | INTRAVENOUS | Status: DC
  Administered 2024-04-03 – 2024-04-05 (×4): 3 mL via INTRAVENOUS

## 2024-04-03 MED ORDER — ENOXAPARIN SODIUM 40 MG/0.4ML IJ SOSY: 40.0000 mg | PREFILLED_SYRINGE | INTRAMUSCULAR | Status: DC

## 2024-04-03 MED ORDER — ACETAMINOPHEN 650 MG RE SUPP
650.0000 mg | Freq: Four times a day (QID) | RECTAL | Status: DC | PRN
Start: 2024-04-03 — End: 2024-04-05

## 2024-04-03 MED ORDER — SIMVASTATIN 20 MG PO TABS
40.0000 mg | ORAL_TABLET | Freq: Every day | ORAL | Status: DC
  Administered 2024-04-03 – 2024-04-04 (×2): 40 mg via ORAL
  Filled 2024-04-03 (×2): qty 2

## 2024-04-03 NOTE — ED Notes (Signed)
 X-ray at bedside.

## 2024-04-03 NOTE — ED Notes (Signed)
 Transported to CT

## 2024-04-03 NOTE — Evaluation (Addendum)
 Physical Therapy Evaluation Patient Details Name: Cynthia [name] MRN: [id number] DOB: [birth year] Today's Date: 04/03/2024  History of Present Illness  Pt is a 78 yo female admitted to [facility] ED on 04/03/24 for syncope and fall at her ILF. PMH of HTN, DM, anxiety.  Clinical Impression  Patient presents with decreased mobility due to decreased balance, decreased activity tolerance, decreased strength and high fall risk.  Noted some changes in BP with OT prior to PT though notably tremulous during standing and mobility on her feet reporting knees feeling weak despite using rollator walker.  She normally lives alone at ILF and functions with rollator on her own.  Feel she will benefit from skilled PT in the acute setting and may need post-acute inpatient rehab prior to d/c home.       If plan is discharge home, recommend the following: A little help with walking and/or transfers;Assistance with cooking/housework;Assist for transportation;Help with stairs or ramp for entrance;A little help with bathing/dressing/bathroom   Can travel by private vehicle   Yes    Equipment Recommendations None recommended by PT  Recommendations for Other Services       Functional Status Assessment Patient has had a recent decline in their functional status and demonstrates the ability to make significant improvements in function in a reasonable and predictable amount of time.     Precautions / Restrictions Precautions Precautions: Fall Recall of Precautions/Restrictions: Intact      Mobility  Bed Mobility               General bed mobility comments: on EOB with OT    Transfers Overall transfer level: Needs assistance Equipment used: Rollator (4 wheels) Transfers: Sit to/from Stand Sit to Stand: Contact guard assist           General transfer comment: assist for balance    Ambulation/Gait Ambulation/Gait assistance: Contact guard assist Gait Distance (Feet): 20 Feet Assistive device: Rollator  (4 wheels) Gait Pattern/deviations: Step-to pattern, Decreased stride length, Shuffle       General Gait Details: slow and effortful and c/o weakness throughout  Stairs            Wheelchair Mobility     Tilt Bed    Modified Rankin (Stroke Patients Only)       Balance Overall balance assessment: Needs assistance Sitting-balance support: Feet supported Sitting balance-Leahy Scale: Good     Standing balance support: Bilateral upper extremity supported Standing balance-Leahy Scale: Poor Standing balance comment: UE support for balance                             Pertinent Vitals/Pain Pain Assessment Pain Assessment: No/denies pain    Home Living Family/patient expects to be discharged to:: Private residence Living Arrangements: Alone Available Help at Discharge: Family;Available PRN/intermittently Type of Home: Independent living facility Home Access: Elevator       Home Layout: One level Home Equipment: Rollator (4 wheels);Hand held shower head;Grab bars - toilet Additional Comments: [facility] ILF, has not been using shower lately.    Prior Function Prior Level of Function : Independent/Modified Independent             Mobility Comments: mod i with Rollator ADLs Comments: ind, she cooks herself. Brother comes over to setup her medications and places them out of her reach.     Extremity/Trunk Assessment   Upper Extremity Assessment Upper Extremity Assessment: Defer to OT evaluation    Lower  Extremity Assessment Lower Extremity Assessment: Generalized weakness    Cervical / Trunk Assessment Cervical / Trunk Assessment: Kyphotic  Communication   Communication Communication: No apparent difficulties    Cognition Arousal: Alert Behavior During Therapy: Anxious   PT - Cognitive impairments: Attention, Memory, Problem solving                       PT - Cognition Comments: slow processing, decreased memory of  events with family having to order meal for her, sustained attention to task limited alternating ability Following commands: Intact       Cueing Cueing Techniques: Verbal cues     General Comments General comments (skin integrity, edema, etc.): OT taking orthostatics upon PT entry    Exercises     Assessment/Plan    PT Assessment Patient needs continued PT services  PT Problem List Decreased strength;Decreased balance;Decreased mobility;Decreased activity tolerance;Decreased safety awareness;Decreased knowledge of use of DME       PT Treatment Interventions DME instruction;Functional mobility training;Gait training;Therapeutic activities;Therapeutic exercise;Balance training;Patient/family education    PT Goals (Current goals can be found in the Care Plan section)  Acute Rehab PT Goals Patient Stated Goal: get stronger PT Goal Formulation: With patient/family Time For Goal Achievement: 04/17/24 Potential to Achieve Goals: Good    Frequency Min 2X/week     Co-evaluation               AM-PAC PT 6 Clicks Mobility  Outcome Measure Help needed turning from your back to your side while in a flat bed without using bedrails?: A Little Help needed moving from lying on your back to sitting on the side of a flat bed without using bedrails?: A Little Help needed moving to and from a bed to a chair (including a wheelchair)?: A Little Help needed standing up from a chair using your arms (e.g., wheelchair or bedside chair)?: A Little Help needed to walk in hospital room?: A Little Help needed climbing 3-5 steps with a railing? : Total 6 Click Score: 16    End of Session Equipment Utilized During Treatment: Gait belt Activity Tolerance: Patient limited by fatigue Patient left: in chair;with call bell/phone within reach;with family/visitor present   PT Visit Diagnosis: Muscle weakness (generalized) (M62.81);Other abnormalities of gait and mobility (R26.89)    Time:  8659-8641 PT Time Calculation (min) (ACUTE ONLY): 18 min   Charges:   PT Evaluation $PT Eval Moderate Complexity: 1 Mod   PT General Charges $$ ACUTE PT VISIT: 1 Visit         Micheline Portal, PT Acute Rehabilitation Services Office:[phone] 04/03/2024   Montie Portal 04/03/2024, 5:56 PM

## 2024-04-03 NOTE — Progress Notes (Signed)
 OT Cancellation Note  Patient Details Name: Cynthia [name] MRN: [id number] DOB: 1945-11-03   Cancelled Treatment:    Reason Eval/Treat Not Completed: Patient at procedure or test/ unavailable (Echo at bedside in the middle of test. OT to follow-up with pt as able.)  04/03/2024  AB, OTR/L  Acute Rehabilitation Services  Office: [phone]   Cynthia [name] 04/03/2024, 11:20 AM

## 2024-04-03 NOTE — ED Provider Triage Note (Signed)
 Emergency Medicine Provider Triage Evaluation Note  Cynthia [name] , a 78 y.o. female  was evaluated in triage.  Pt complains of unwitnessed fall.  She is oriented to person and place only.  She does not know what happened.  She was found down in her facility and apparently told staff that she may have passed out but cannot give any history.  Does not remove or falling.  EMS reports she complains of mouth pain and initial heart rate was in the 220s.  She was given 10 mg of Cardizem  for suspected atrial fibrillation and heart rate improved to the 110s.  Unknown history of atrial fibrillation.  No blood thinner use.  She denies head, neck, back, chest or abdominal pain..  Review of Systems  Positive: Fall Negative:   Physical Exam  BP (!) 145/65   Pulse 75   Resp 18   Ht 5' 2 (1.575 m)   Wt 90.1 kg   SpO2 100%   BMI 36.33 kg/m  Gen:   Awake, no distress   Resp:  Normal effort  MSK:   Moves extremities without difficulty  Other:  Unequal pupils, right greater than left.  Mucosal laceration to the right lower lip.  Medical Decision Making  Medically screening exam initiated at 6:55 AM.  Appropriate orders placed.  Ronal JINNY Gaskins was informed that the remainder of the evaluation will be completed by another provider, this initial triage assessment does not replace that evaluation, and the importance of remaining in the ED until their evaluation is complete.  Unwitnessed fall with possible syncope.  Atrial fibrillation for EMS responded to Cardizem .  Unequal pupils on arrival.  Does not take any blood thinners.  Taken to CT scan.   Carita Senior, MD 04/03/24 [phone]

## 2024-04-03 NOTE — Plan of Care (Signed)

## 2024-04-03 NOTE — ED Provider Notes (Signed)
 [place] EMERGENCY DEPARTMENT AT [facility] Provider Note   CSN: [id number] Arrival date & time: 04/03/24  9359     Patient presents with: Cynthia [name]   Cynthia [name] is a 78 y.o. female.   Patient here after fall may be syncopal episode at her independent living facility.  She has a history of hypertension high cholesterol diabetes anxiety disorder recurrent syncope per her chart, dizziness, memory changes.  She cannot really give me any details about what happened today.  Remembers getting up try to walk to the bathroom next and she knew she was on the floor.  She has some pain in her inner lip where she got a cut.  She does not know she is on blood thinners.  She was in a tachycardic rhythm with EMS and they gave diltiazem  and she converted back to a normal sinus rhythm.  She does not think she is on anticoagulation.  She uses assistive devices to walk around.  She cannot really tell me if she is having any new weakness numbness tingling when she last felt normal.  She denies any headache neck pain abdominal pain nausea vomiting diarrhea.  Denies any fever or chills.  Denies any recent illness.  The history is provided by the patient.       Prior to Admission medications   Medication Sig Start Date End Date Taking? Authorizing Provider  amLODipine  (NORVASC ) 5 MG tablet Take 5 mg by mouth daily. 10/14/19   [provider]  aspirin EC 81 MG tablet Take 81 mg by mouth at bedtime.    [provider]  Calcium Carbonate Antacid (TUMS E-X PO) Take by mouth as needed.    [provider]  cholecalciferol (VITAMIN D ) 1000 UNITS tablet Take 2,000 Units by mouth daily.     [provider]  donepezil  (ARICEPT ) 5 MG tablet Take 5 mg by mouth at bedtime.    [provider]  famotidine  (PEPCID ) 10 MG tablet Take 10 mg by mouth as needed.    [provider]  ferrous sulfate 325 (65 FE) MG tablet Take 325 mg by mouth daily with breakfast.     [provider]  hydrochlorothiazide  (HYDRODIURIL ) 25 MG tablet Take 25 mg by mouth daily.    [provider]  icosapent  Ethyl (VASCEPA ) 1 g capsule Take 2 capsules (2 g total) by mouth 2 (two) times daily. 01/25/22   Hobart Powell BRAVO, MD  lisinopril  (ZESTRIL ) 40 MG tablet Take 40 mg by mouth daily. 10/04/19   [provider]  metFORMIN  (GLUCOPHAGE ) 500 MG tablet TAKE 1 TABLET BY MOUTH TWICE DAILY WITH A MEAL 05/26/17   Christi Glendia HERO, MD  metoprolol  tartrate (LOPRESSOR ) 100 MG tablet Take 1 tablet (100 mg total) by mouth once for 1 dose. Take 90-120 minutes prior to scan. 12/21/21 12/21/21  Hobart Powell BRAVO, MD  Multiple Vitamins-Minerals (MULTIPLE VITAMINS/WOMENS PO) Take 1 tablet by mouth daily.    [provider]  naproxen sodium (ANAPROX) 220 MG tablet Take 440 mg by mouth daily as needed (back pain).     [provider]  potassium chloride  SA (K-DUR,KLOR-CON ) 20 MEQ tablet TAKE 1 TABLET(20 MEQ) BY MOUTH DAILY 02/06/17   Christi Glendia HERO, MD  simvastatin  (ZOCOR ) 40 MG tablet Take 1 tablet (40 mg total) by mouth at bedtime. 06/07/16   Christi Glendia HERO, MD  vitamin B-12 (CYANOCOBALAMIN) 1000 MCG tablet Take 1,000 mcg by mouth daily.    [provider]  Allergies: Patient has no known allergies.    Review of Systems  Updated Vital Signs BP (!) 130/58   Pulse 70   Resp 14   Ht 5' 2 (1.575 m)   Wt 90.1 kg   SpO2 99%   BMI 36.33 kg/m   Physical Exam Vitals and nursing note reviewed.  Constitutional:      General: She is not in acute distress.    Appearance: She is well-developed. She is not ill-appearing.  HENT:     Head: Normocephalic and atraumatic.     Nose: Nose normal.     Mouth/Throat:     Mouth: Mucous membranes are moist.  Eyes:     Extraocular Movements: Extraocular movements intact.     Conjunctiva/sclera: Conjunctivae normal.     Comments: Pupils are slightly unequal but reactive  Cardiovascular:     Rate and  Rhythm: Normal rate and regular rhythm.     Pulses: Normal pulses.     Heart sounds: Normal heart sounds. No murmur heard. Pulmonary:     Effort: Pulmonary effort is normal. No respiratory distress.     Breath sounds: Normal breath sounds.  Abdominal:     General: Abdomen is flat.     Palpations: Abdomen is soft.     Tenderness: There is no abdominal tenderness.  Musculoskeletal:        General: No swelling.     Cervical back: Normal range of motion and neck supple.  Skin:    General: Skin is warm and dry.     Capillary Refill: Capillary refill takes less than 2 seconds.  Neurological:     General: No focal deficit present.     Mental Status: She is alert and oriented to person, place, and time.     Cranial Nerves: No cranial nerve deficit.     Sensory: No sensory deficit.     Motor: No weakness.     Coordination: Coordination normal.     Comments: Trace weakness on the left but otherwise normal strength throughout, normal speech normal sensation normal coordination normal visual fields  Psychiatric:        Mood and Affect: Mood normal.     (all labs ordered are listed, but only abnormal results are displayed) Labs Reviewed  COMPREHENSIVE METABOLIC PANEL WITH GFR - Abnormal; Notable for the following components:      Result Value   Chloride 97 (*)    Glucose, Bld 159 (*)    Total Protein 6.0 (*)    GFR, Estimated 58 (*)    All other components within normal limits  CBC - Abnormal; Notable for the following components:   RBC 3.63 (*)    Hemoglobin 11.6 (*)    HCT 34.4 (*)    All other components within normal limits  URINALYSIS, ROUTINE W REFLEX MICROSCOPIC - Abnormal; Notable for the following components:   Color, Urine STRAW (*)    Ketones, ur 5 (*)    All other components within normal limits  I-STAT CHEM 8, ED - Abnormal; Notable for the following components:   Sodium 133 (*)    Creatinine, Ser 1.10 (*)    Glucose, Bld 158 (*)    Calcium, Ion 1.10 (*)     Hemoglobin 10.5 (*)    HCT 31.0 (*)    All other components within normal limits  ETHANOL  PROTIME-INR  I-STAT CG4 LACTIC ACID, ED  SAMPLE TO BLOOD BANK  TROPONIN I (HIGH SENSITIVITY)    EKG: EKG Interpretation  Date/Time:  Wednesday April 03 2024 07:34:39 EDT Ventricular Rate:  72 PR Interval:  173 QRS Duration:  82 QT Interval:  411 QTC Calculation: 450 R Axis:   -12  Text Interpretation: Sinus rhythm Low voltage, extremity and precordial leads Confirmed by Ruthe Cornet [phone] on 04/03/2024 7:44:43 AM  Radiology: [facility] Pelvis Portable Result Date: 04/03/2024 CLINICAL DATA:  Unwitnessed fall. EXAM: PORTABLE PELVIS 1-2 VIEWS COMPARISON:  None Available. FINDINGS: There is no evidence of pelvic fracture or diastasis. No pelvic bone lesions are seen. IMPRESSION: Negative. Electronically Signed   By: Lynwood Landy Raddle M.D.   On: 04/03/2024 07:49   DG Chest Port 1 View Result Date: 04/03/2024 CLINICAL DATA:  Unwitnessed fall. EXAM: PORTABLE CHEST 1 VIEW COMPARISON:  June 07, 2016. FINDINGS: The heart size and mediastinal contours are within normal limits. Both lungs are clear. The visualized skeletal structures are unremarkable. IMPRESSION: No active disease. Electronically Signed   By: Lynwood Landy Raddle M.D.   On: 04/03/2024 07:48   CT HEAD WO CONTRAST Result Date: 04/03/2024 CLINICAL DATA:  Unwitnessed fall. The patient was found down in her facility and told staff she probably had a syncopal episode but does not remember it. Blunt head and face trauma with neck pain. EXAM: CT HEAD WITHOUT CONTRAST CT MAXILLOFACIAL WITHOUT CONTRAST CT CERVICAL SPINE WITHOUT CONTRAST TECHNIQUE: Multidetector CT imaging of the head, cervical spine, and maxillofacial structures were performed using the standard protocol without intravenous contrast. Multiplanar CT image reconstructions of the cervical spine and maxillofacial structures were also generated. RADIATION DOSE REDUCTION: This exam was performed  according to the departmental dose-optimization program which includes automated exposure control, adjustment of the mA and/or kV according to patient size and/or use of iterative reconstruction technique. COMPARISON:  MRI brain 08/31/2020, CT scan face and cervical spine both 06/02/2015. FINDINGS: CT HEAD FINDINGS Brain: There is mild to moderate cerebral and mild cerebellar atrophy, with mild small vessel disease of the cerebral white matter and atrophic ventriculomegaly. There are chronic perivascular spaces in the basal ganglia. No cortical based acute infarct, hemorrhage, mass or mass effect are seen. There is no midline shift.  The basal cisterns are clear. Vascular: Calcific plaques both siphons, both distal vertebral arteries. No hyperdense central vessel is seen. Skull: Negative for fractures or focal lesions. Other: None. CT MAXILLOFACIAL FINDINGS Osseous: A displaced chronic fracture is again noted of the left maxillary second bicuspid tooth. There are periapical lucency along the root structure of this tooth as well as of the 12 year left maxillary molar, which again demonstrates posterior decay. No acute dental abnormality is seen by follow-up with a dentist is recommended. Both mandibular third molars are chronically impacted. No facial bone fracture is seen. No mandibular fracture or dislocation. Orbits: The bony orbits and globes are intact. No intraorbital mass, hematoma or fluid collection is seen. Old lens replacements are again noted as well as bilateral dorsal uveoscleral staphylomas. Sinuses: There is chronic membrane thickening in the floor of the left maxillary sinus. Mild chronic membrane thickening in the ethmoids. Other paranasal sinuses, the bilateral mastoid air cells, and bilateral middle ears are clear. Nasal septum is midline. Both ostiomeatal complexes are patent. There are intact nasal turbinates. Soft tissues: No facial hematoma or mass. Right parotid gland is slightly larger than  the left but this was seen in 2016 as well. Both parotid glands show fatty infiltration as before. CT CERVICAL SPINE FINDINGS Alignment: There is a chronic reversed lordosis, chronic degenerative grade 1 anterolisthesis  at C3-4. Again also noted is bone-on-bone anterior atlantodental joint space loss with osteophytes. No new or traumatic alignment abnormality is seen, no other listhesis. Skull base and vertebrae: No acute fracture is evident. There is osteopenia. No primary bone lesion or focal pathologic process. Soft tissues and spinal canal: No prevertebral fluid or swelling. No visible canal hematoma. There is a heterogeneous 1.9 cm nodule in the posterior aspect of the right lobe of the thyroid  gland, previously 1.6 cm. Nonemergent follow-up thyroid  ultrasound is recommended. There are moderate to heavy calcifications in both proximal cervical ICAs. No laryngeal mass. Disc levels: The disc heights are only normal at C2-3 and C7-T1, with disc space loss at all other intervening levels with greatest disc collapse at C5-6 and C6-7. There are bidirectional endplate spurs at these levels as well, but no high-grade encroachment on the spinal canal is seen. No overt spondylotic cord compression. The C2-3 facet joints are chronically ankylosed. There is facet and uncinate joint hypertrophy causing multilevel acquired foraminal stenosis. The greatest foraminal stenosis is on the right at C3-4, and on the right greater than left C5-6 with moderate foraminal stenosis C6-7. Upper chest: Negative. Other: None. IMPRESSION: 1. No acute intracranial CT findings or depressed skull fractures. 2. Atrophy and small-vessel disease. 3. No evidence of facial bone fractures. 4. Chronic fracture of the left maxillary second bicuspid tooth with pronounced periapical lucency along the root structure of this tooth as well as of the 12 year left maxillary molar, which again demonstrates posterior decay. Follow-up with a dentist is  recommended. 5. 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 6. 1.9 cm heterogeneous nodule in the right lobe of the thyroid  gland, previously 1.6 cm. Nonemergent follow-up ultrasound recommended. 7. Moderate to heavy calcifications in both proximal cervical ICAs. Electronically Signed   By: Francis Quam M.D.   On: 04/03/2024 07:47   CT CERVICAL SPINE WO CONTRAST Result Date: 04/03/2024 CLINICAL DATA:  Unwitnessed fall. The patient was found down in her facility and told staff she probably had a syncopal episode but does not remember it. Blunt head and face trauma with neck pain. EXAM: CT HEAD WITHOUT CONTRAST CT MAXILLOFACIAL WITHOUT CONTRAST CT CERVICAL SPINE WITHOUT CONTRAST TECHNIQUE: Multidetector CT imaging of the head, cervical spine, and maxillofacial structures were performed using the standard protocol without intravenous contrast. Multiplanar CT image reconstructions of the cervical spine and maxillofacial structures were also generated. RADIATION DOSE REDUCTION: This exam was performed according to the departmental dose-optimization program which includes automated exposure control, adjustment of the mA and/or kV according to patient size and/or use of iterative reconstruction technique. COMPARISON:  MRI brain 08/31/2020, CT scan face and cervical spine both 06/02/2015. FINDINGS: CT HEAD FINDINGS Brain: There is mild to moderate cerebral and mild cerebellar atrophy, with mild small vessel disease of the cerebral white matter and atrophic ventriculomegaly. There are chronic perivascular spaces in the basal ganglia. No cortical based acute infarct, hemorrhage, mass or mass effect are seen. There is no midline shift.  The basal cisterns are clear. Vascular: Calcific plaques both siphons, both distal vertebral arteries. No hyperdense central vessel is seen. Skull: Negative for fractures or focal lesions. Other: None. CT  MAXILLOFACIAL FINDINGS Osseous: A displaced chronic fracture is again noted of the left maxillary second bicuspid tooth. There are periapical lucency along the root structure of this tooth as well as of the 12 year left maxillary molar, which again demonstrates posterior decay. No acute  dental abnormality is seen by follow-up with a dentist is recommended. Both mandibular third molars are chronically impacted. No facial bone fracture is seen. No mandibular fracture or dislocation. Orbits: The bony orbits and globes are intact. No intraorbital mass, hematoma or fluid collection is seen. Old lens replacements are again noted as well as bilateral dorsal uveoscleral staphylomas. Sinuses: There is chronic membrane thickening in the floor of the left maxillary sinus. Mild chronic membrane thickening in the ethmoids. Other paranasal sinuses, the bilateral mastoid air cells, and bilateral middle ears are clear. Nasal septum is midline. Both ostiomeatal complexes are patent. There are intact nasal turbinates. Soft tissues: No facial hematoma or mass. Right parotid gland is slightly larger than the left but this was seen in 2016 as well. Both parotid glands show fatty infiltration as before. CT CERVICAL SPINE FINDINGS Alignment: There is a chronic reversed lordosis, chronic degenerative grade 1 anterolisthesis at C3-4. Again also noted is bone-on-bone anterior atlantodental joint space loss with osteophytes. No new or traumatic alignment abnormality is seen, no other listhesis. Skull base and vertebrae: No acute fracture is evident. There is osteopenia. No primary bone lesion or focal pathologic process. Soft tissues and spinal canal: No prevertebral fluid or swelling. No visible canal hematoma. There is a heterogeneous 1.9 cm nodule in the posterior aspect of the right lobe of the thyroid  gland, previously 1.6 cm. Nonemergent follow-up thyroid  ultrasound is recommended. There are moderate to heavy calcifications in both  proximal cervical ICAs. No laryngeal mass. Disc levels: The disc heights are only normal at C2-3 and C7-T1, with disc space loss at all other intervening levels with greatest disc collapse at C5-6 and C6-7. There are bidirectional endplate spurs at these levels as well, but no high-grade encroachment on the spinal canal is seen. No overt spondylotic cord compression. The C2-3 facet joints are chronically ankylosed. There is facet and uncinate joint hypertrophy causing multilevel acquired foraminal stenosis. The greatest foraminal stenosis is on the right at C3-4, and on the right greater than left C5-6 with moderate foraminal stenosis C6-7. Upper chest: Negative. Other: None. IMPRESSION: 1. No acute intracranial CT findings or depressed skull fractures. 2. Atrophy and small-vessel disease. 3. No evidence of facial bone fractures. 4. Chronic fracture of the left maxillary second bicuspid tooth with pronounced periapical lucency along the root structure of this tooth as well as of the 12 year left maxillary molar, which again demonstrates posterior decay. Follow-up with a dentist is recommended. 5. 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 6. 1.9 cm heterogeneous nodule in the right lobe of the thyroid  gland, previously 1.6 cm. Nonemergent follow-up ultrasound recommended. 7. Moderate to heavy calcifications in both proximal cervical ICAs. Electronically Signed   By: Francis Quam M.D.   On: 04/03/2024 07:47   CT MAXILLOFACIAL WO CONTRAST Result Date: 04/03/2024 CLINICAL DATA:  Unwitnessed fall. The patient was found down in her facility and told staff she probably had a syncopal episode but does not remember it. Blunt head and face trauma with neck pain. EXAM: CT HEAD WITHOUT CONTRAST CT MAXILLOFACIAL WITHOUT CONTRAST CT CERVICAL SPINE WITHOUT CONTRAST TECHNIQUE: Multidetector CT imaging of the head, cervical spine, and maxillofacial  structures were performed using the standard protocol without intravenous contrast. Multiplanar CT image reconstructions of the cervical spine and maxillofacial structures were also generated. RADIATION DOSE REDUCTION: This exam was performed according to the departmental dose-optimization program which includes automated exposure control, adjustment of the mA  and/or kV according to patient size and/or use of iterative reconstruction technique. COMPARISON:  MRI brain 08/31/2020, CT scan face and cervical spine both 06/02/2015. FINDINGS: CT HEAD FINDINGS Brain: There is mild to moderate cerebral and mild cerebellar atrophy, with mild small vessel disease of the cerebral white matter and atrophic ventriculomegaly. There are chronic perivascular spaces in the basal ganglia. No cortical based acute infarct, hemorrhage, mass or mass effect are seen. There is no midline shift.  The basal cisterns are clear. Vascular: Calcific plaques both siphons, both distal vertebral arteries. No hyperdense central vessel is seen. Skull: Negative for fractures or focal lesions. Other: None. CT MAXILLOFACIAL FINDINGS Osseous: A displaced chronic fracture is again noted of the left maxillary second bicuspid tooth. There are periapical lucency along the root structure of this tooth as well as of the 12 year left maxillary molar, which again demonstrates posterior decay. No acute dental abnormality is seen by follow-up with a dentist is recommended. Both mandibular third molars are chronically impacted. No facial bone fracture is seen. No mandibular fracture or dislocation. Orbits: The bony orbits and globes are intact. No intraorbital mass, hematoma or fluid collection is seen. Old lens replacements are again noted as well as bilateral dorsal uveoscleral staphylomas. Sinuses: There is chronic membrane thickening in the floor of the left maxillary sinus. Mild chronic membrane thickening in the ethmoids. Other paranasal sinuses, the  bilateral mastoid air cells, and bilateral middle ears are clear. Nasal septum is midline. Both ostiomeatal complexes are patent. There are intact nasal turbinates. Soft tissues: No facial hematoma or mass. Right parotid gland is slightly larger than the left but this was seen in 2016 as well. Both parotid glands show fatty infiltration as before. CT CERVICAL SPINE FINDINGS Alignment: There is a chronic reversed lordosis, chronic degenerative grade 1 anterolisthesis at C3-4. Again also noted is bone-on-bone anterior atlantodental joint space loss with osteophytes. No new or traumatic alignment abnormality is seen, no other listhesis. Skull base and vertebrae: No acute fracture is evident. There is osteopenia. No primary bone lesion or focal pathologic process. Soft tissues and spinal canal: No prevertebral fluid or swelling. No visible canal hematoma. There is a heterogeneous 1.9 cm nodule in the posterior aspect of the right lobe of the thyroid  gland, previously 1.6 cm. Nonemergent follow-up thyroid  ultrasound is recommended. There are moderate to heavy calcifications in both proximal cervical ICAs. No laryngeal mass. Disc levels: The disc heights are only normal at C2-3 and C7-T1, with disc space loss at all other intervening levels with greatest disc collapse at C5-6 and C6-7. There are bidirectional endplate spurs at these levels as well, but no high-grade encroachment on the spinal canal is seen. No overt spondylotic cord compression. The C2-3 facet joints are chronically ankylosed. There is facet and uncinate joint hypertrophy causing multilevel acquired foraminal stenosis. The greatest foraminal stenosis is on the right at C3-4, and on the right greater than left C5-6 with moderate foraminal stenosis C6-7. Upper chest: Negative. Other: None. IMPRESSION: 1. No acute intracranial CT findings or depressed skull fractures. 2. Atrophy and small-vessel disease. 3. No evidence of facial bone fractures. 4. Chronic  fracture of the left maxillary second bicuspid tooth with pronounced periapical lucency along the root structure of this tooth as well as of the 12 year left maxillary molar, which again demonstrates posterior decay. Follow-up with a dentist is recommended. 5. 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 6. 1.9 cm heterogeneous  nodule in the right lobe of the thyroid  gland, previously 1.6 cm. Nonemergent follow-up ultrasound recommended. 7. Moderate to heavy calcifications in both proximal cervical ICAs. Electronically Signed   By: Francis Quam M.D.   On: 04/03/2024 07:47     Procedures   Medications Ordered in the ED - No data to display                                  Medical Decision Making Risk Decision regarding hospitalization.   Cynthia [name] is here after syncopal episode at her independent living.  History of memory issues hypertension and diabetes and per her chart may be recurrent syncope.  Overall she does not really remember what happened today.  She can tell me she got up next and she knew she was on the floor.  She states that she thinks she tried to go to the bathroom.  She has got a injury to her inner lower lip but otherwise I do not see any other acute injury on exam.  She had a little bit of trace weakness on the left side of her body she does not know if this is new or old.  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  Her medical chart also the same.  She seems a little slow to respond.  Differential diagnosis could be orthostatic vasovagal process could be deconditioning could be less likely stroke or seizure or cardiac process including arrhythmia.  EKG with EMS looks to be some sort of atrial tachycardia they gave IV diltiazem  and she is back in normal sinus rhythm on my review and interpretation of EKG.  Overall we will get  CT scan of the head face neck x-ray of the chest and pelvis and check basic labs and reevaluate.  She lives at independent living.  She seems little bit altered I think would be reasonable to admit her for syncope ops for echocardiogram telemetry PT OT.  Lab work shows no signs of urinary tract infection.  No significant leukocytosis anemia or electrolyte abnormality.  Troponin normal.  Lactic acid normal.  Imaging shows no acute processes as well including the CT scan of head neck face x-ray of the pelvis and chest.  Ultimately she has remained hemodynamically stable throughout my care.  I think it be reasonable to admit her for further syncope ops.  History somewhat difficult to obtain.  I cannot really tell if some of the left-sided weakness is chronic or if she is given poor effort.  When I look at neurology note from 9 years ago they mention some trace left-sided weakness.  But I do not see any CVA history.  Overall we will admit to hospitalist for further care.  Will discuss with them about maybe pursuing an MRI of the brain.  This chart was dictated using voice recognition software.  Despite best efforts to proofread,  errors can occur which can change the documentation meaning.      Final diagnoses:  Syncope, unspecified syncope type  Injury of lip, initial encounter    ED Discharge Orders     None          Ruthe Cornet, DO 04/03/24 [phone]

## 2024-04-03 NOTE — ED Triage Notes (Addendum)
 Pt arrived via [facility] from [facility] due to an unwitnessed fall. Pt states LOC when she fell. Pt not on blood thinners. En route, pt was in aflutter and afib. Unsure if altered mental status is baseline or new. Pt is A&O x2. 10mg  of Cartizem and 700 ml NS given en route.  150/90 HR 220 141 CBG

## 2024-04-03 NOTE — H&P (Addendum)
 History and Physical    Patient: Cynthia [name] FMW:[id number] DOB: 10-10-1945 DOA: 04/03/2024 DOS: the patient was seen and examined on 04/03/2024 PCP: Larnell Hamilton, MD  Patient coming from: [facility] via EMS  Chief Complaint:  Chief Complaint  Patient presents with   Fall   HPI: Cynthia [name] is a 78 y.o. female with medical history significant of hypertension, hyperlipidemia, syncope, diabetes mellitus type 2, morbid obesity who presents after having a unwitnessed  fall. She is accompanied by her brothers and sister-in-law.  She has been experiencing progressively worsening memory issues. Her primary care physician increased her dose of donepezil  in late July or August due to these concerns. She is usually alert and oriented to self and place, but sometimes loses track of the day.  She experienced a fall after getting up to use the bathroom at a facility, although she does not recall the event. She was found on the floor and brought to the hospital for evaluation. She reports a headache, particularly at the back of her head. An MRI of the brain and an echocardiogram have been performed, but results are pending.  Her sodium levels were mildly low on recent laboratory testing. She consumes a diet high in salt. Her hemoglobin was slightly decreased from previous levels. She confirms eating and drinking adequately. Urine tests showed no signs of infection.  In the emergency department patient was noted to be afebrile with blood pressures 132/46-155/101, and all other vital signs maintained.  Labs significant for hemoglobin 11.6, sodium 135, chloride 97, glucose 159, lactic acid 1.8, and high-sensitivity troponin negative.  CT scan of the head and cervical spine did not note any acute intracranial findings or fractures.  Chest and pelvis x-rays did not reveal any acute abnormality.  Review of Systems: As mentioned in the history of present illness. All other systems reviewed and  are negative. Past Medical History:  Diagnosis Date   Anxiety disorder    Asthmatic bronchitis    Colonic polyp    Degenerative joint disease    Diabetes mellitus    Dizziness    Headache(784.0)    Hypercholesteremia    Hypertension    Lumbar back pain    Memory changes    Morbid obesity (HCC)    Syncope    recurrent   Syncope    Venous insufficiency    Past Surgical History:  Procedure Laterality Date   CARPAL TUNNEL RELEASE     CATARACT EXTRACTION  2016   COLONOSCOPY     DILATION AND CURETTAGE OF UTERUS     LUMBAR LAMINECTOMY  1997   Dr. Lang   PELVIC ABCESS DRAINAGE  1970   Dr. EMERSON Finn   POLYPECTOMY     Social History:  reports that she quit smoking about 45 years ago. Her smoking use included cigarettes. She started smoking about 51 years ago. She has a 2.4 pack-year smoking history. She has never used smokeless tobacco. She reports that she does not drink alcohol and does not use drugs.  No Known Allergies  Family History  Problem Relation Age of Onset   Heart disease Mother    Diabetes Mother    Heart attack Mother        deceased 37   Hypertension Father    Heart disease Father    Heart attack Father        prior to 79   Diabetes Brother    Heart disease Brother    Diabetes Brother  Stroke Brother    Hypertension Brother    Heart failure Brother    Diabetes Maternal Grandmother    Diabetes Paternal Grandmother    Diabetes Other        sibling    Prior to Admission medications   Medication Sig Start Date End Date Taking? Authorizing Provider  amLODipine  (NORVASC ) 5 MG tablet Take 5 mg by mouth daily. 10/14/19   [provider]  aspirin EC 81 MG tablet Take 81 mg by mouth at bedtime.    [provider]  Calcium Carbonate Antacid (TUMS E-X PO) Take by mouth as needed.    [provider]  cholecalciferol (VITAMIN D ) 1000 UNITS tablet Take 2,000 Units by mouth daily.     [provider]  donepezil  (ARICEPT )  5 MG tablet Take 5 mg by mouth at bedtime.    [provider]  famotidine  (PEPCID ) 10 MG tablet Take 10 mg by mouth as needed.    [provider]  ferrous sulfate 325 (65 FE) MG tablet Take 325 mg by mouth daily with breakfast.    [provider]  hydrochlorothiazide  (HYDRODIURIL ) 25 MG tablet Take 25 mg by mouth daily.    [provider]  icosapent  Ethyl (VASCEPA ) 1 g capsule Take 2 capsules (2 g total) by mouth 2 (two) times daily. 01/25/22   Hobart Powell BRAVO, MD  lisinopril  (ZESTRIL ) 40 MG tablet Take 40 mg by mouth daily. 10/04/19   [provider]  metFORMIN  (GLUCOPHAGE ) 500 MG tablet TAKE 1 TABLET BY MOUTH TWICE DAILY WITH A MEAL 05/26/17   Christi Glendia HERO, MD         Multiple Vitamins-Minerals (MULTIPLE VITAMINS/WOMENS PO) Take 1 tablet by mouth daily.    [provider]  naproxen sodium (ANAPROX) 220 MG tablet Take 440 mg by mouth daily as needed (back pain).     [provider]  potassium chloride  SA (K-DUR,KLOR-CON ) 20 MEQ tablet TAKE 1 TABLET(20 MEQ) BY MOUTH DAILY 02/06/17   Christi Glendia HERO, MD  simvastatin  (ZOCOR ) 40 MG tablet Take 1 tablet (40 mg total) by mouth at bedtime. 06/07/16   Christi Glendia HERO, MD  vitamin B-12 (CYANOCOBALAMIN) 1000 MCG tablet Take 1,000 mcg by mouth daily.    [provider]    Physical Exam: Vitals:   04/03/24 0745 04/03/24 0800 04/03/24 0815 04/03/24 0829  BP: (!) 130/58 (!) 130/97 (!) 113/46 (!) 132/46  Pulse: 70 77 65   Resp: 14 16 15    SpO2: 99% 98% 95%   Weight:      Height:       Constitutional: Elderly female who appears to be lethargic but able to follow commands Eyes: Pupils are slightly and equal, but family notes history of cataract surgery ENMT: Mucous membranes are moist.   Neck: normal, supple  Respiratory: clear to auscultation bilaterally, no wheezing, no crackles. Normal respiratory effort. No accessory muscle use.  Cardiovascular: Regular rate and rhythm, no  murmurs / rubs / gallops. No extremity edema. 2+ pedal pulses. No carotid bruits.  Abdomen: no tenderness, no masses palpated. Bowel sounds positive.  Musculoskeletal: no clubbing / cyanosis. No joint deformity upper and lower extremities. Good ROM, no contractures. Skin: no rashes, lesions, ulcers.  Neurologic: CN 2-12 grossly intact.  Strength 4+/5 on the left and strength 5/5 on the right. Psychiatric: Poor recent memory.  Alert and oriented x to person and place.  Data Reviewed:  EKG reveals sinus rhythm at 72 bpm.  Reviewed labs,  imaging, and pertinent records as documented.  Assessment and Plan:  Syncope/fall Patient presents after having an unwitnessed fall.  Review of records note history of syncope and fainting spells back in 2021 -2022 where it appears she had been evaluated by cardiology and had an echocardiogram and symptoms were thought to possibly be vagal in nature.  Subsequently had been followed up by neurology and had EEG which was noted to be normal.  Suspect symptoms likely secondary to orthostatic hypotension. - Admit to telemetry bed - Check orthostatic vital signs - Check TSH - Check repeat echocardiogram - Check MRI brain - PT/OT to evaluate and treat  Normocytic anemia Hemoglobin 11.6-> 10.5.  No reports of bleeding - Recheck H&H  Essential hypertension Blood pressures currently elevated up to 155/101. - Continue lisinopril , amlodipine  - Held hydrochlorothiazide   Controlled diabetes mellitus type 2, without long-term use of insulin  On admission glucose noted to be 159.  No recent hemoglobin A1c available - Hypoglycemic protocols - Hold metformin  - Check hemoglobin A1c - CBGs before every meal with very sensitive SSI - Adjust insulin  regimen as deemed medically appropriate  Dementia Patient has poor recent memory, but family notes that she is able to recognize them and usually is alert to person and most of the time place. - Continue Aricept   Dental  caries Patient has a left molar that has posterior decay - Recommend outpatient follow-up with dentistry  Obesity, class II BMI 36.33 kg/m  DVT prophylaxis: Lovenox  Advance Care Planning:   Code Status: Full Code    Consults: none  Family Communication: Patient's brother updated at bedside Severity of Illness: The appropriate patient status for this patient is OBSERVATION. Observation status is judged to be reasonable and necessary in order to provide the required intensity of service to ensure the patient's safety. The patient's presenting symptoms, physical exam findings, and initial radiographic and laboratory data in the context of their medical condition is felt to place them at decreased risk for further clinical deterioration. Furthermore, it is anticipated that the patient will be medically stable for discharge from the hospital within 2 midnights of admission.   Author: Maximino DELENA Sharps, MD 04/03/2024 8:35 AM  For on call review [email or web address].

## 2024-04-03 NOTE — Plan of Care (Signed)

## 2024-04-03 NOTE — Care Management Obs Status (Signed)
 MEDICARE OBSERVATION STATUS NOTIFICATION   Patient Details  Name: Cynthia [name] MRN: [id number] Date of Birth: 1946/03/31   Medicare Observation Status Notification Given:     Obs notice signed and copy given   Claretta Deed 04/03/2024, 1:33 PM

## 2024-04-03 NOTE — ED Notes (Signed)
 Pt at CT w/ night RN.

## 2024-04-03 NOTE — Evaluation (Signed)
 Occupational Therapy Evaluation Patient Details Name: Cynthia [name] MRN: [id number] DOB: Jan 01, 1946 Today's Date: 04/03/2024   History of Present Illness   Pt is a 78 yo female admitted to [facility] ED on 04/03/24 for syncope and fall at her ILF. PMH of HTN, DM, anxiety.     Clinical Impressions Pt admitted for above, PTA pt lived alone at an ILF and was ind with ADLs, had assist with medication management from family and ambulated with Rollator. Pt currently c/o dizziness with OOB mobility, negative for orthostatics upon eval. Pt needing CGA for safe mobility and CGA to setup A for ADLs. She may benefit from a formal vestibular assessment. OT to continue following pt acutely to address listed deficits and progress pt as able. Patient would benefit from post acute Home OT services to help maximize functional independence in natural environment.  Orthostatic VS for the past 24 hrs (Last 3 readings):  BP- Lying BP- Sitting BP- Standing at 0 minutes BP- Standing at 3 minutes  04/03/24 1535 130/57 141/73 118/55 124/65        If plan is discharge home, recommend the following:   Assistance with cooking/housework;Assist for transportation     Functional Status Assessment   Patient has had a recent decline in their functional status and demonstrates the ability to make significant improvements in function in a reasonable and predictable amount of time.     Equipment Recommendations   Tub/shower seat     Recommendations for Other Services         Precautions/Restrictions   Precautions Precautions: Fall Recall of Precautions/Restrictions: Intact Restrictions Weight Bearing Restrictions Per Provider Order: No     Mobility Bed Mobility Overal bed mobility: Needs Assistance Bed Mobility: Supine to Sit, Sit to Supine     Supine to sit: Supervision Sit to supine: Supervision   General bed mobility comments: no physical assist needed    Transfers Overall transfer level: Needs  assistance Equipment used: Rolling walker (2 wheels) Transfers: Sit to/from Stand Sit to Stand: Contact guard assist           General transfer comment: STSx2 from EOB CGA for safety.      Balance Overall balance assessment: Needs assistance Sitting-balance support: No upper extremity supported, Feet supported Sitting balance-Leahy Scale: Good       Standing balance-Leahy Scale: Fair Standing balance comment: BUE support to ambulate                           ADL either performed or assessed with clinical judgement   ADL Overall ADL's : Needs assistance/impaired Eating/Feeding: Independent;Sitting   Grooming: Standing;Contact guard assist   Upper Body Bathing: Sitting;Set up   Lower Body Bathing: Set up;Sitting/lateral leans   Upper Body Dressing : Set up;Sitting   Lower Body Dressing: Sit to/from stand;Sitting/lateral leans;Contact guard assist   Toilet Transfer: Contact guard assist;Ambulation;Rolling walker (2 wheels);BSC/3in1 Toilet Transfer Details (indicate cue type and reason): BSC over toilet Toileting- Clothing Manipulation and Hygiene: Contact guard assist;Sit to/from stand       Functional mobility during ADLs: Contact guard assist;Rolling walker (2 wheels)       Vision   Additional Comments: would benefit from vestibular assessment     Perception         Praxis         Pertinent Vitals/Pain Pain Assessment Pain Assessment: No/denies pain     Extremity/Trunk Assessment Upper Extremity Assessment Upper Extremity Assessment: Overall [facility]  for tasks assessed   Lower Extremity Assessment Lower Extremity Assessment: Defer to PT evaluation       Communication Communication Communication: No apparent difficulties   Cognition Arousal: Alert Behavior During Therapy: WFL for tasks assessed/performed     Orientation impairments: Time Awareness: Intellectual awareness intact       OT - Cognition Comments: Pt a bit  questionable of her home setup, had trouble thinking of it but so did her family members. A&Ox3, unsure of month/year                 Following commands: Intact       Cueing  General Comments   Cueing Techniques: Verbal cues;Gestural cues  see note for orthostatics.   Exercises     Shoulder Instructions      Home Living Family/patient expects to be discharged to:: Other (Comment) Living Arrangements: Alone   Type of Home: Independent living facility Home Access: Elevator (2nd floor)     Home Layout: One level     Bathroom Shower/Tub: Walk-in shower;Curtain;Sponge bathes at baseline (with a lip)   Bathroom Toilet: Standard     Home Equipment: Rollator (4 wheels);Hand held shower head;Grab bars - toilet   Additional Comments: [facility], has not been using shower lately.      Prior Functioning/Environment Prior Level of Function : Independent/Modified Independent             Mobility Comments: mod i with Rollator ADLs Comments: ind, she cooks herself. Brother comes over to setup her medications and places them out of her reach.    OT Problem List: Impaired balance (sitting and/or standing);Obesity   OT Treatment/Interventions: Self-care/ADL training;Therapeutic exercise;Therapeutic activities;Patient/family education;Balance training      OT Goals(Current goals can be found in the care plan section)   Acute Rehab OT Goals Patient Stated Goal: To hopefully go home OT Goal Formulation: With patient Time For Goal Achievement: 04/17/24 Potential to Achieve Goals: Good ADL Goals Pt Will Perform Grooming: with modified independence;standing Pt Will Perform Lower Body Bathing: with modified independence;sit to/from stand Pt Will Perform Lower Body Dressing: with modified independence;sit to/from stand Pt Will Transfer to Toilet: with modified independence;ambulating Pt Will Perform Tub/Shower Transfer: Shower transfer;with modified  independence;ambulating   OT Frequency:  Min 2X/week    Co-evaluation              AM-PAC OT 6 Clicks Daily Activity     Outcome Measure Help from another person eating meals?: None Help from another person taking care of personal grooming?: A Little Help from another person toileting, which includes using toliet, bedpan, or urinal?: A Little Help from another person bathing (including washing, rinsing, drying)?: A Little Help from another person to put on and taking off regular upper body clothing?: A Little Help from another person to put on and taking off regular lower body clothing?: A Little 6 Click Score: 19   End of Session Equipment Utilized During Treatment: Gait belt;Rolling walker (2 wheels) Nurse Communication: Mobility status;Precautions (Dizzy when OOB)  Activity Tolerance: Patient tolerated treatment well Patient left: in bed;with call bell/phone within reach;Other (comment);with family/visitor present (EOB with providing PT)  OT Visit Diagnosis: Unsteadiness on feet (R26.81);Other abnormalities of gait and mobility (R26.89);Dizziness and giddiness (R42)                Time: 1319-1340 OT Time Calculation (min): 21 min Charges:  OT General Charges $OT Visit: 1 Visit OT Evaluation $OT Eval Low Complexity: 1 Low  04/03/2024  AB, OTR/L  Acute Rehabilitation Services  Office: [phone]   Cynthia [name] 04/03/2024, 3:45 PM

## 2024-04-04 ENCOUNTER — Observation Stay (HOSPITAL_COMMUNITY)

## 2024-04-04 LAB — BASIC METABOLIC PANEL WITH GFR
Anion gap: 18 — ABNORMAL HIGH (ref 5–15)
BUN: 11 mg/dL (ref 8–23)
CO2: 20 mmol/L — ABNORMAL LOW (ref 22–32)
Calcium: 9.4 mg/dL (ref 8.9–10.3)
Chloride: 98 mmol/L (ref 98–111)
Creatinine, Ser: 0.8 mg/dL (ref 0.44–1.00)
GFR, Estimated: >60 mL/min (ref >60)
Glucose, Bld: 117 mg/dL — ABNORMAL HIGH (ref 70–99)
Potassium: 3.6 mmol/L (ref 3.5–5.1)
Sodium: 136 mmol/L (ref 135–145)

## 2024-04-04 LAB — CBC
HCT: 35.6 % — ABNORMAL LOW (ref 36.0–46.0)
Hemoglobin: 12 g/dL (ref 12.0–15.0)
MCH: 32 pg (ref 26.0–34.0)
MCHC: 33.7 g/dL (ref 30.0–36.0)
MCV: 94.9 fL (ref 80.0–100.0)
Platelets: 186 K/uL (ref 150–400)
RBC: 3.75 MIL/uL — ABNORMAL LOW (ref 3.87–5.11)
RDW: 13 % (ref 11.5–15.5)
WBC: 7.8 K/uL (ref 4.0–10.5)
nRBC: 0 % (ref 0.0–0.2)

## 2024-04-04 LAB — GLUCOSE, CAPILLARY
Glucose-Capillary: 140 mg/dL — ABNORMAL HIGH (ref 70–99)
Glucose-Capillary: 221 mg/dL — ABNORMAL HIGH (ref 70–99)
Glucose-Capillary: 97 mg/dL (ref 70–99)
Glucose-Capillary: 124 mg/dL — ABNORMAL HIGH (ref 70–99)

## 2024-04-04 LAB — HEMOGLOBIN A1C
Hgb A1c MFr Bld: 5.8 % — ABNORMAL HIGH (ref 4.8–5.6)
Mean Plasma Glucose: 119.76 mg/dL

## 2024-04-04 NOTE — Progress Notes (Signed)
 PROGRESS NOTE    Cynthia [name]  FMW:[id number] DOB: 06/23/1946 DOA: 04/03/2024 PCP: Larnell Hamilton, MD   Brief Narrative:    Assessment & Plan:   Principal Problem:   Syncope Active Problems:   Fall   Normocytic anemia   Essential hypertension   Diabetes mellitus type 2, controlled, without complications (HCC)   Dementia without behavioral disturbance (HCC)   Obesity, Class II, BMI 35-39.9, isolated   Assessment and Plan:   Syncope/fall Patient presents after having an unwitnessed fall.  Review of records note history of syncope and fainting spells back in 2021 -2022 where it appears she had been evaluated by cardiology and had an echocardiogram and symptoms were thought to possibly be vagal in nature.  Subsequently had been followed up by neurology and had EEG which was noted to be normal.    - Workup including CT head, CT cervical neck, MRI brain, echocardiogram are all negative. - telemetry monitoring -Evaluated by PT/OT, recommending SNF.   Normocytic anemia Hemoglobin 11.6-> 10.5.  No reports of bleeding - Recheck H&H   Essential hypertension Blood pressures currently elevated up to 155/101. - Continue lisinopril , amlodipine  - continue to hydrochlorothiazide    Controlled diabetes mellitus type 2, without long-term use of insulin  On admission glucose noted to be 159.  No recent hemoglobin A1c available - Hypoglycemic protocols - Hold metformin  - Check hemoglobin A1c - CBGs before every meal with very sensitive SSI - Adjust insulin  regimen as deemed medically appropriate   Dementia Patient has poor recent memory, but family notes that she is able to recognize them and usually is alert to person and most of the time place. - Continue Aricept    Dental caries Patient has a left molar that has posterior decay - Recommend outpatient follow-up with dentistry   Obesity, class II BMI 36.33 kg/m   DVT prophylaxis: Lovenox  Advance Care Planning:   Code Status: Full  Code     Consults: none     Subjective:   Objective: Vitals:   04/04/24 0006 04/04/24 0404 04/04/24 0811 04/04/24 1155  BP: (!) 127/49 (!) 121/56 (!) 123/49 (!) 119/50  Pulse: 71 74 72 83  Resp: 16 16    Temp: 98.2 F (36.8 C) 98.2 F (36.8 C) 98.2 F (36.8 C) 98.5 F (36.9 C)  TempSrc:      SpO2: 98% 96% 96% 100%  Weight:      Height:        Intake/Output Summary (Last 24 hours) at 04/04/2024 1522 Last data filed at 04/04/2024 1230 Gross per 24 hour  Intake 400 ml  Output --  Net 400 ml   Filed Weights   04/03/24 0650  Weight: 90.1 kg    Examination:  General exam: Appears calm and comfortable  Respiratory system: Bilateral decreased breath sounds at bases Cardiovascular system: S1 & S2 heard, Rate controlled Gastrointestinal system: Abdomen is nondistended, soft and nontender. Normal bowel sounds heard. Extremities: No cyanosis, clubbing, edema  Central nervous system: Alert and oriented. No focal neurological deficits. Moving extremities Skin: No rashes, lesions or ulcers Psychiatry: Judgement and insight appear normal. Mood & affect appropriate.     Data Reviewed: I have personally reviewed following labs and imaging studies  CBC: Recent Labs  Lab 04/03/24 0655 04/03/24 0703 04/03/24 1420 04/04/24 0208  WBC 7.7  --   --  7.8  HGB 11.6* 10.5* 12.0 12.0  HCT 34.4* 31.0* 35.1* 35.6*  MCV 94.8  --   --  94.9  PLT 184  --   --  186   Basic Metabolic Panel: Recent Labs  Lab 04/03/24 0655 04/03/24 0703 04/04/24 0208  NA 135 133* 136  K 3.6 3.6 3.6  CL 97* 98 98  CO2 23  --  20*  GLUCOSE 159* 158* 117*  BUN 19 21 11   CREATININE 1.00 1.10* 0.80  CALCIUM 9.0  --  9.4   GFR: Estimated Creatinine Clearance: 62.4 mL/min (by C-G formula based on SCr of 0.8 mg/dL). Liver Function Tests: Recent Labs  Lab 04/03/24 0655  AST 29  ALT 23  ALKPHOS 40  BILITOT 0.9  PROT 6.0*  ALBUMIN 3.5   No results for input(s): LIPASE, AMYLASE in the last  168 hours. No results for input(s): AMMONIA in the last 168 hours. Coagulation Profile: Recent Labs  Lab 04/03/24 0655  INR 0.9   Cardiac Enzymes: No results for input(s): CKTOTAL, CKMB, CKMBINDEX, TROPONINI in the last 168 hours. BNP (last 3 results) No results for input(s): PROBNP in the last 8760 hours. HbA1C: Recent Labs    04/04/24 0208  HGBA1C 5.8*   CBG: Recent Labs  Lab 04/03/24 1702 04/03/24 2101 04/04/24 0810 04/04/24 1156  GLUCAP 139* 121* 140* 221*   Lipid Profile: No results for input(s): CHOL, HDL, LDLCALC, TRIG, CHOLHDL, LDLDIRECT in the last 72 hours. Thyroid  Function Tests: Recent Labs    04/03/24 0917  TSH 0.415   Anemia Panel: No results for input(s): VITAMINB12, FOLATE, FERRITIN, TIBC, IRON, RETICCTPCT in the last 72 hours. Sepsis Labs: Recent Labs  Lab 04/03/24 0703  LATICACIDVEN 1.8    No results found for this or any previous visit (from the past 240 hours).       Radiology Studies: ECHOCARDIOGRAM COMPLETE Result Date: 04/03/2024    ECHOCARDIOGRAM REPORT   Patient Name:   Cynthia [name] Date of Exam: 04/03/2024 Medical Rec #:  [id number]    Height:       62.0 in Accession #:    [id number]   Weight:       198.6 lb Date of Birth:  29-Sep-1945     BSA:          1.906 m Patient Age:    78 years     BP:           122/95 mmHg Patient Gender: F            HR:           68 bpm. Exam Location:  Inpatient Procedure: 2D Echo, Cardiac Doppler, Color Doppler and Intracardiac            Opacification Agent (Both Spectral and Color Flow Doppler were            utilized during procedure). Indications:    Syncope  History:        Patient has prior history of Echocardiogram examinations, most                 recent 12/16/2019. Risk Factors:Hypertension, Diabetes, Morbid                 Obesity and Dyslipidemia.  Sonographer:    Therisa Crouch Referring Phys: [id number] RONDELL A SMITH IMPRESSIONS  1. Left ventricular ejection fraction, by  estimation, is 65 to 70%. The left ventricle has normal function. The left ventricle has no regional wall motion abnormalities. There is mild left ventricular hypertrophy. Left ventricular diastolic parameters are consistent with Grade I diastolic dysfunction (impaired relaxation).  2. Right ventricular systolic function is normal. The right ventricular  size is normal. There is normal pulmonary artery systolic pressure. The estimated right ventricular systolic pressure is 18.5 mmHg.  3. The mitral valve is normal in structure. Trivial mitral valve regurgitation. No evidence of mitral stenosis.  4. The aortic valve was not well visualized. Aortic valve regurgitation is not visualized. No aortic stenosis is present.  5. The inferior vena cava is normal in size with greater than 50% respiratory variability, suggesting right atrial pressure of 3 mmHg. FINDINGS  Left Ventricle: Left ventricular ejection fraction, by estimation, is 65 to 70%. The left ventricle has normal function. The left ventricle has no regional wall motion abnormalities. The left ventricular internal cavity size was normal in size. There is  mild left ventricular hypertrophy. Left ventricular diastolic parameters are consistent with Grade I diastolic dysfunction (impaired relaxation). Right Ventricle: The right ventricular size is normal. No increase in right ventricular wall thickness. Right ventricular systolic function is normal. There is normal pulmonary artery systolic pressure. The tricuspid regurgitant velocity is 1.97 m/s, and  with an assumed right atrial pressure of 3 mmHg, the estimated right ventricular systolic pressure is 18.5 mmHg. Left Atrium: Left atrial size was normal in size. Right Atrium: Right atrial size was normal in size. Pericardium: There is no evidence of pericardial effusion. Mitral Valve: The mitral valve is normal in structure. Trivial mitral valve regurgitation. No evidence of mitral valve stenosis. Tricuspid Valve:  The tricuspid valve is normal in structure. Tricuspid valve regurgitation is trivial. Aortic Valve: The aortic valve was not well visualized. Aortic valve regurgitation is not visualized. No aortic stenosis is present. Aortic valve mean gradient measures 6.0 mmHg. Aortic valve peak gradient measures 12.0 mmHg. Aortic valve area, by VTI measures 1.59 cm. Pulmonic Valve: The pulmonic valve was not well visualized. Pulmonic valve regurgitation is trivial. Aorta: The aortic root and ascending aorta are structurally normal, with no evidence of dilitation. Venous: The inferior vena cava is normal in size with greater than 50% respiratory variability, suggesting right atrial pressure of 3 mmHg. IAS/Shunts: The interatrial septum was not well visualized.  LEFT VENTRICLE PLAX 2D LVIDd:         3.80 cm   Diastology LVIDs:         2.60 cm   LV e' medial:    9.32 cm/s LV PW:         1.10 cm   LV E/e' medial:  8.2 LV IVS:        0.80 cm   LV e' lateral:   7.46 cm/s LVOT diam:     1.80 cm   LV E/e' lateral: 10.2 LV SV:         52 LV SV Index:   27 LVOT Area:     2.54 cm  RIGHT VENTRICLE             IVC RV Basal diam:  2.70 cm     IVC diam: 1.70 cm RV S prime:     14.60 cm/s TAPSE (M-mode): 2.6 cm LEFT ATRIUM             Index LA diam:        3.20 cm 1.68 cm/m LA Vol (A2C):   22.7 ml 11.91 ml/m LA Vol (A4C):   28.1 ml 14.74 ml/m LA Biplane Vol: 26.8 ml 14.06 ml/m  AORTIC VALVE AV Area (Vmax):    1.60 cm AV Area (Vmean):   1.58 cm AV Area (VTI):     1.59 cm AV Vmax:  173.00 cm/s AV Vmean:          112.000 cm/s AV VTI:            0.327 m AV Peak Grad:      12.0 mmHg AV Mean Grad:      6.0 mmHg LVOT Vmax:         109.00 cm/s LVOT Vmean:        69.500 cm/s LVOT VTI:          0.204 m LVOT/AV VTI ratio: 0.62  AORTA Ao Root diam: 2.80 cm Ao Asc diam:  3.00 cm MITRAL VALVE                TRICUSPID VALVE MV Area (PHT): 2.90 cm     TR Peak grad:   15.5 mmHg MV Decel Time: 262 msec     TR Vmax:        197.00 cm/s MV E  velocity: 76.40 cm/s MV A velocity: 101.00 cm/s  SHUNTS MV E/A ratio:  0.76         Systemic VTI:  0.20 m                             Systemic Diam: 1.80 cm Lonni Nanas MD Electronically signed by Lonni Nanas MD Signature Date/Time: 04/03/2024/2:45:38 PM    Final    MR BRAIN WO CONTRAST Result Date: 04/03/2024 EXAM: MRI BRAIN WITHOUT CONTRAST 04/03/2024 12:39:00 PM TECHNIQUE: Multiplanar multisequence MRI of the head/brain was performed without the administration of intravenous contrast. COMPARISON: Same day CT head and MRI head 131 22. CLINICAL HISTORY: Stroke, follow up. Dizziness with fall today. Concern for stroke. FINDINGS: BRAIN AND VENTRICLES: No acute infarct. No intracranial hemorrhage. No mass. No midline shift. No hydrocephalus. The sella is unremarkable. Normal flow voids. Similar appearance of prominent perivascular spaces in the inferior aspect of the basal ganglia. Mild parenchymal volume loss. There are nonspecific hyperintense foci in the subcortical and periventricular white matter that most likely represent chronic microangiopathic ischemic changes in a patient of this age. ORBITS: Lateral lens replacement elongation of the globes with abnormal posterior contour suggestive of axial myopia. SINUSES AND MASTOIDS: No acute abnormality. BONES AND SOFT TISSUES: Normal marrow signal. No acute soft tissue abnormality. IMPRESSION: 1. No acute intracranial abnormality. 2. Mild parenchymal volume loss and chronic microangiopathic ischemic changes. Electronically signed by: Donnice Mania MD 04/03/2024 01:39 PM EDT RP Workstation: [id number]   DG Pelvis Portable Result Date: 04/03/2024 CLINICAL DATA:  Unwitnessed fall. EXAM: PORTABLE PELVIS 1-2 VIEWS COMPARISON:  None Available. FINDINGS: There is no evidence of pelvic fracture or diastasis. No pelvic bone lesions are seen. IMPRESSION: Negative. Electronically Signed   By: Lynwood Landy Raddle M.D.   On: 04/03/2024 07:49   DG Chest Port 1  View Result Date: 04/03/2024 CLINICAL DATA:  Unwitnessed fall. EXAM: PORTABLE CHEST 1 VIEW COMPARISON:  June 07, 2016. FINDINGS: The heart size and mediastinal contours are within normal limits. Both lungs are clear. The visualized skeletal structures are unremarkable. IMPRESSION: No active disease. Electronically Signed   By: Lynwood Landy Raddle M.D.   On: 04/03/2024 07:48   CT HEAD WO CONTRAST Result Date: 04/03/2024 CLINICAL DATA:  Unwitnessed fall. The patient was found down in her facility and told staff she probably had a syncopal episode but does not remember it. Blunt head and face trauma with neck pain. EXAM: CT HEAD WITHOUT CONTRAST CT MAXILLOFACIAL WITHOUT CONTRAST CT CERVICAL  SPINE WITHOUT CONTRAST TECHNIQUE: Multidetector CT imaging of the head, cervical spine, and maxillofacial structures were performed using the standard protocol without intravenous contrast. Multiplanar CT image reconstructions of the cervical spine and maxillofacial structures were also generated. RADIATION DOSE REDUCTION: This exam was performed according to the departmental dose-optimization program which includes automated exposure control, adjustment of the mA and/or kV according to patient size and/or use of iterative reconstruction technique. COMPARISON:  MRI brain 08/31/2020, CT scan face and cervical spine both 06/02/2015. FINDINGS: CT HEAD FINDINGS Brain: There is mild to moderate cerebral and mild cerebellar atrophy, with mild small vessel disease of the cerebral white matter and atrophic ventriculomegaly. There are chronic perivascular spaces in the basal ganglia. No cortical based acute infarct, hemorrhage, mass or mass effect are seen. There is no midline shift.  The basal cisterns are clear. Vascular: Calcific plaques both siphons, both distal vertebral arteries. No hyperdense central vessel is seen. Skull: Negative for fractures or focal lesions. Other: None. CT MAXILLOFACIAL FINDINGS Osseous: A displaced chronic  fracture is again noted of the left maxillary second bicuspid tooth. There are periapical lucency along the root structure of this tooth as well as of the 12 year left maxillary molar, which again demonstrates posterior decay. No acute dental abnormality is seen by follow-up with a dentist is recommended. Both mandibular third molars are chronically impacted. No facial bone fracture is seen. No mandibular fracture or dislocation. Orbits: The bony orbits and globes are intact. No intraorbital mass, hematoma or fluid collection is seen. Old lens replacements are again noted as well as bilateral dorsal uveoscleral staphylomas. Sinuses: There is chronic membrane thickening in the floor of the left maxillary sinus. Mild chronic membrane thickening in the ethmoids. Other paranasal sinuses, the bilateral mastoid air cells, and bilateral middle ears are clear. Nasal septum is midline. Both ostiomeatal complexes are patent. There are intact nasal turbinates. Soft tissues: No facial hematoma or mass. Right parotid gland is slightly larger than the left but this was seen in 2016 as well. Both parotid glands show fatty infiltration as before. CT CERVICAL SPINE FINDINGS Alignment: There is a chronic reversed lordosis, chronic degenerative grade 1 anterolisthesis at C3-4. Again also noted is bone-on-bone anterior atlantodental joint space loss with osteophytes. No new or traumatic alignment abnormality is seen, no other listhesis. Skull base and vertebrae: No acute fracture is evident. There is osteopenia. No primary bone lesion or focal pathologic process. Soft tissues and spinal canal: No prevertebral fluid or swelling. No visible canal hematoma. There is a heterogeneous 1.9 cm nodule in the posterior aspect of the right lobe of the thyroid  gland, previously 1.6 cm. Nonemergent follow-up thyroid  ultrasound is recommended. There are moderate to heavy calcifications in both proximal cervical ICAs. No laryngeal mass. Disc levels:  The disc heights are only normal at C2-3 and C7-T1, with disc space loss at all other intervening levels with greatest disc collapse at C5-6 and C6-7. There are bidirectional endplate spurs at these levels as well, but no high-grade encroachment on the spinal canal is seen. No overt spondylotic cord compression. The C2-3 facet joints are chronically ankylosed. There is facet and uncinate joint hypertrophy causing multilevel acquired foraminal stenosis. The greatest foraminal stenosis is on the right at C3-4, and on the right greater than left C5-6 with moderate foraminal stenosis C6-7. Upper chest: Negative. Other: None. IMPRESSION: 1. No acute intracranial CT findings or depressed skull fractures. 2. Atrophy and small-vessel disease. 3. No evidence of facial bone fractures. 4. Chronic  fracture of the left maxillary second bicuspid tooth with pronounced periapical lucency along the root structure of this tooth as well as of the 12 year left maxillary molar, which again demonstrates posterior decay. Follow-up with a dentist is recommended. 5. 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 6. 1.9 cm heterogeneous nodule in the right lobe of the thyroid  gland, previously 1.6 cm. Nonemergent follow-up ultrasound recommended. 7. Moderate to heavy calcifications in both proximal cervical ICAs. Electronically Signed   By: Francis Quam M.D.   On: 04/03/2024 07:47   CT CERVICAL SPINE WO CONTRAST Result Date: 04/03/2024 CLINICAL DATA:  Unwitnessed fall. The patient was found down in her facility and told staff she probably had a syncopal episode but does not remember it. Blunt head and face trauma with neck pain. EXAM: CT HEAD WITHOUT CONTRAST CT MAXILLOFACIAL WITHOUT CONTRAST CT CERVICAL SPINE WITHOUT CONTRAST TECHNIQUE: Multidetector CT imaging of the head, cervical spine, and maxillofacial structures were performed using the standard protocol  without intravenous contrast. Multiplanar CT image reconstructions of the cervical spine and maxillofacial structures were also generated. RADIATION DOSE REDUCTION: This exam was performed according to the departmental dose-optimization program which includes automated exposure control, adjustment of the mA and/or kV according to patient size and/or use of iterative reconstruction technique. COMPARISON:  MRI brain 08/31/2020, CT scan face and cervical spine both 06/02/2015. FINDINGS: CT HEAD FINDINGS Brain: There is mild to moderate cerebral and mild cerebellar atrophy, with mild small vessel disease of the cerebral white matter and atrophic ventriculomegaly. There are chronic perivascular spaces in the basal ganglia. No cortical based acute infarct, hemorrhage, mass or mass effect are seen. There is no midline shift.  The basal cisterns are clear. Vascular: Calcific plaques both siphons, both distal vertebral arteries. No hyperdense central vessel is seen. Skull: Negative for fractures or focal lesions. Other: None. CT MAXILLOFACIAL FINDINGS Osseous: A displaced chronic fracture is again noted of the left maxillary second bicuspid tooth. There are periapical lucency along the root structure of this tooth as well as of the 12 year left maxillary molar, which again demonstrates posterior decay. No acute dental abnormality is seen by follow-up with a dentist is recommended. Both mandibular third molars are chronically impacted. No facial bone fracture is seen. No mandibular fracture or dislocation. Orbits: The bony orbits and globes are intact. No intraorbital mass, hematoma or fluid collection is seen. Old lens replacements are again noted as well as bilateral dorsal uveoscleral staphylomas. Sinuses: There is chronic membrane thickening in the floor of the left maxillary sinus. Mild chronic membrane thickening in the ethmoids. Other paranasal sinuses, the bilateral mastoid air cells, and bilateral middle ears are  clear. Nasal septum is midline. Both ostiomeatal complexes are patent. There are intact nasal turbinates. Soft tissues: No facial hematoma or mass. Right parotid gland is slightly larger than the left but this was seen in 2016 as well. Both parotid glands show fatty infiltration as before. CT CERVICAL SPINE FINDINGS Alignment: There is a chronic reversed lordosis, chronic degenerative grade 1 anterolisthesis at C3-4. Again also noted is bone-on-bone anterior atlantodental joint space loss with osteophytes. No new or traumatic alignment abnormality is seen, no other listhesis. Skull base and vertebrae: No acute fracture is evident. There is osteopenia. No primary bone lesion or focal pathologic process. Soft tissues and spinal canal: No prevertebral fluid or swelling. No visible canal hematoma. There is a heterogeneous 1.9 cm nodule in the posterior aspect of  the right lobe of the thyroid  gland, previously 1.6 cm. Nonemergent follow-up thyroid  ultrasound is recommended. There are moderate to heavy calcifications in both proximal cervical ICAs. No laryngeal mass. Disc levels: The disc heights are only normal at C2-3 and C7-T1, with disc space loss at all other intervening levels with greatest disc collapse at C5-6 and C6-7. There are bidirectional endplate spurs at these levels as well, but no high-grade encroachment on the spinal canal is seen. No overt spondylotic cord compression. The C2-3 facet joints are chronically ankylosed. There is facet and uncinate joint hypertrophy causing multilevel acquired foraminal stenosis. The greatest foraminal stenosis is on the right at C3-4, and on the right greater than left C5-6 with moderate foraminal stenosis C6-7. Upper chest: Negative. Other: None. IMPRESSION: 1. No acute intracranial CT findings or depressed skull fractures. 2. Atrophy and small-vessel disease. 3. No evidence of facial bone fractures. 4. Chronic fracture of the left maxillary second bicuspid tooth with  pronounced periapical lucency along the root structure of this tooth as well as of the 12 year left maxillary molar, which again demonstrates posterior decay. Follow-up with a dentist is recommended. 5. 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 6. 1.9 cm heterogeneous nodule in the right lobe of the thyroid  gland, previously 1.6 cm. Nonemergent follow-up ultrasound recommended. 7. Moderate to heavy calcifications in both proximal cervical ICAs. Electronically Signed   By: Francis Quam M.D.   On: 04/03/2024 07:47   CT MAXILLOFACIAL WO CONTRAST Result Date: 04/03/2024 CLINICAL DATA:  Unwitnessed fall. The patient was found down in her facility and told staff she probably had a syncopal episode but does not remember it. Blunt head and face trauma with neck pain. EXAM: CT HEAD WITHOUT CONTRAST CT MAXILLOFACIAL WITHOUT CONTRAST CT CERVICAL SPINE WITHOUT CONTRAST TECHNIQUE: Multidetector CT imaging of the head, cervical spine, and maxillofacial structures were performed using the standard protocol without intravenous contrast. Multiplanar CT image reconstructions of the cervical spine and maxillofacial structures were also generated. RADIATION DOSE REDUCTION: This exam was performed according to the departmental dose-optimization program which includes automated exposure control, adjustment of the mA and/or kV according to patient size and/or use of iterative reconstruction technique. COMPARISON:  MRI brain 08/31/2020, CT scan face and cervical spine both 06/02/2015. FINDINGS: CT HEAD FINDINGS Brain: There is mild to moderate cerebral and mild cerebellar atrophy, with mild small vessel disease of the cerebral white matter and atrophic ventriculomegaly. There are chronic perivascular spaces in the basal ganglia. No cortical based acute infarct, hemorrhage, mass or mass effect are seen. There is no midline shift.  The basal cisterns are  clear. Vascular: Calcific plaques both siphons, both distal vertebral arteries. No hyperdense central vessel is seen. Skull: Negative for fractures or focal lesions. Other: None. CT MAXILLOFACIAL FINDINGS Osseous: A displaced chronic fracture is again noted of the left maxillary second bicuspid tooth. There are periapical lucency along the root structure of this tooth as well as of the 12 year left maxillary molar, which again demonstrates posterior decay. No acute dental abnormality is seen by follow-up with a dentist is recommended. Both mandibular third molars are chronically impacted. No facial bone fracture is seen. No mandibular fracture or dislocation. Orbits: The bony orbits and globes are intact. No intraorbital mass, hematoma or fluid collection is seen. Old lens replacements are again noted as well as bilateral dorsal uveoscleral staphylomas. Sinuses: There is chronic membrane thickening in the floor of the left maxillary  sinus. Mild chronic membrane thickening in the ethmoids. Other paranasal sinuses, the bilateral mastoid air cells, and bilateral middle ears are clear. Nasal septum is midline. Both ostiomeatal complexes are patent. There are intact nasal turbinates. Soft tissues: No facial hematoma or mass. Right parotid gland is slightly larger than the left but this was seen in 2016 as well. Both parotid glands show fatty infiltration as before. CT CERVICAL SPINE FINDINGS Alignment: There is a chronic reversed lordosis, chronic degenerative grade 1 anterolisthesis at C3-4. Again also noted is bone-on-bone anterior atlantodental joint space loss with osteophytes. No new or traumatic alignment abnormality is seen, no other listhesis. Skull base and vertebrae: No acute fracture is evident. There is osteopenia. No primary bone lesion or focal pathologic process. Soft tissues and spinal canal: No prevertebral fluid or swelling. No visible canal hematoma. There is a heterogeneous 1.9 cm nodule in the  posterior aspect of the right lobe of the thyroid  gland, previously 1.6 cm. Nonemergent follow-up thyroid  ultrasound is recommended. There are moderate to heavy calcifications in both proximal cervical ICAs. No laryngeal mass. Disc levels: The disc heights are only normal at C2-3 and C7-T1, with disc space loss at all other intervening levels with greatest disc collapse at C5-6 and C6-7. There are bidirectional endplate spurs at these levels as well, but no high-grade encroachment on the spinal canal is seen. No overt spondylotic cord compression. The C2-3 facet joints are chronically ankylosed. There is facet and uncinate joint hypertrophy causing multilevel acquired foraminal stenosis. The greatest foraminal stenosis is on the right at C3-4, and on the right greater than left C5-6 with moderate foraminal stenosis C6-7. Upper chest: Negative. Other: None. IMPRESSION: 1. No acute intracranial CT findings or depressed skull fractures. 2. Atrophy and small-vessel disease. 3. No evidence of facial bone fractures. 4. Chronic fracture of the left maxillary second bicuspid tooth with pronounced periapical lucency along the root structure of this tooth as well as of the 12 year left maxillary molar, which again demonstrates posterior decay. Follow-up with a dentist is recommended. 5. 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 6. 1.9 cm heterogeneous nodule in the right lobe of the thyroid  gland, previously 1.6 cm. Nonemergent follow-up ultrasound recommended. 7. Moderate to heavy calcifications in both proximal cervical ICAs. Electronically Signed   By: Francis Quam M.D.   On: 04/03/2024 07:47        Scheduled Meds:  amLODipine   5 mg Oral Daily   donepezil   5 mg Oral QHS   enoxaparin  (LOVENOX ) injection  40 mg Subcutaneous Q24H   insulin  aspart  0-6 Units Subcutaneous TID WC   lisinopril   40 mg Oral Daily   simvastatin   40 mg  Oral QHS   sodium chloride  flush  3 mL Intravenous Q12H   Continuous Infusions:        Neythan Kozlov, MD [facility] 04/04/2024, 3:22 PM

## 2024-04-04 NOTE — Care Management Obs Status (Signed)
 MEDICARE OBSERVATION STATUS NOTIFICATION   Patient Details  Name: Cynthia [name] MRN: [id number] Date of Birth: [birth year]   Medicare Observation Status Notification Given:  Yes  Obs notice signed and copy given   Claretta Deed 04/04/2024, 8:01 AM

## 2024-04-04 NOTE — Plan of Care (Signed)
  Problem: Education: Goal: Knowledge of General Education information will improve Description: Including pain rating scale, medication(s)/side effects and non-pharmacologic comfort measures Outcome: Progressing   Problem: Health Behavior/Discharge Planning: Goal: Ability to manage health-related needs will improve Outcome: Progressing   Problem: Clinical Measurements: Goal: Respiratory complications will improve Outcome: Progressing   Problem: Clinical Measurements: Goal: Cardiovascular complication will be avoided Outcome: Progressing   Problem: Activity: Goal: Risk for activity intolerance will decrease Outcome: Progressing   Problem: Nutrition: Goal: Adequate nutrition will be maintained Outcome: Progressing   Problem: Coping: Goal: Level of anxiety will decrease Outcome: Progressing   Problem: Elimination: Goal: Will not experience complications related to bowel motility Outcome: Progressing   Problem: Safety: Goal: Ability to remain free from injury will improve Outcome: Progressing   Problem: Nutritional: Goal: Maintenance of adequate nutrition will improve Outcome: Progressing   Problem: Skin Integrity: Goal: Risk for impaired skin integrity will decrease Outcome: Progressing

## 2024-04-04 NOTE — Progress Notes (Signed)
 Patient who was received alert and stable on room air. Patient had not experienced any syncopal event since admission. Patient is however weak and is assisted with care and going to the bathroom. Patient is on fall-risk precautions, her care to continue per plan and recommendations.

## 2024-04-04 NOTE — Progress Notes (Signed)
 Mobility Specialist: Progress Note   04/04/24 0900  Mobility  Activity Dangled on edge of bed  Level of Assistance Minimal assist, patient does 75% or more  Activity Response Tolerated fair  Mobility Referral Yes  Mobility visit 1 Mobility  Mobility Specialist Start Time (ACUTE ONLY) [phone]  Mobility Specialist Stop Time (ACUTE ONLY) 0931  Mobility Specialist Time Calculation (min) (ACUTE ONLY) 15 min    Sitting EOB: BP 137/59, HR 88 Supine (Post Mobility): BP 131/52, HR 75  Pt received in bed, agreeable to mobility session initially. MinA for bed mobility to assist with trunk elevation, increased time needed as well. Pt c/o severe dizziness while coming to sit up. BP reading 137/59 and HR 88 at EOB. Pt took a rest break while sitting EOB before standing, but pt stated dizziness was not improving and she needed to lay back down. Did not think she would be to get back up even with a rest break and declined further mobility. BP reading 131/52 and HR 75 in supine. Left in bed with all needs met, call bell in reach.   Cynthia [name] Mobility Specialist Please contact via SecureChat or Rehab office at [phone]

## 2024-04-04 NOTE — Progress Notes (Signed)
 Physical Therapy Treatment Patient Details Name: Cynthia [name] MRN: [id number] DOB: 05-04-1946 Today's Date: 04/04/2024   History of Present Illness Pt is a 78 yo female admitted to [facility] ED on 04/03/24 for syncope and fall at her [facility]F. PMH of HTN, DM, anxiety.    PT Comments  Patient progressing with ambulation in the room and with sit to stand practice.  Noted orthostatics improved today as well as pt tolerance to activity.  She was able to use [facility] with CGA for safety during hygiene.  She was still having difficulty with recall of lunch and her brother's visit from yesterday.  Remains appropriate for post-acute inpatient rehab (<3 hours/day) at d/c.     If plan is discharge home, recommend the following: A little help with walking and/or transfers;Assistance with cooking/housework;Assist for transportation;Help with stairs or ramp for entrance;A little help with bathing/dressing/bathroom   Can travel by private vehicle     Yes  Equipment Recommendations  None recommended by PT    Recommendations for Other Services       Precautions / Restrictions Precautions Precautions: Fall Recall of Precautions/Restrictions: Impaired Precaution/Restrictions Comments: watch BP     Mobility  Bed Mobility Overal bed mobility: Needs Assistance       Supine to sit: Supervision Sit to supine: Min assist   General bed mobility comments: increased time, effortful and cues for technique, assist to supine for feet onto bed with cues for technique and positioning near [facility]    Transfers Overall transfer level: Needs assistance Equipment used: Rollator (4 wheels) Transfers: Sit to/from Stand, Bed to chair/wheelchair/BSC Sit to Stand: Contact guard assist   Step pivot transfers: Contact guard assist       General transfer comment: assist for anterior weight shift; stepping to [facility] with rollator and A for balance    Ambulation/Gait Ambulation/Gait assistance: Contact guard assist Gait Distance  (Feet): 45 Feet Assistive device: Rollator (4 wheels) Gait Pattern/deviations: Step-through pattern, Decreased stride length, Trunk flexed       General Gait Details: in room twice to door and back to EOB; declined hallway ambulation; assist for safety   Stairs             Wheelchair Mobility     Tilt Bed    Modified Rankin (Stroke Patients Only)       Balance Overall balance assessment: Needs assistance Sitting-balance support: Feet unsupported Sitting balance-Leahy Scale: Fair     Standing balance support: Bilateral upper extremity supported Standing balance-Leahy Scale: Poor Standing balance comment: UE support for balance                            Communication Communication Communication: No apparent difficulties  Cognition Arousal: Alert Behavior During Therapy: WFL for tasks assessed/performed   PT - Cognitive impairments: Attention, Memory, Problem solving                       PT - Cognition Comments: does not remember if she ate lunch, did not remember brother visiting yesterday Following commands: Intact      Cueing Cueing Techniques: Verbal cues  Exercises Other Exercises Other Exercises: sit<>stand x 4 CGA and cues    General Comments General comments (skin integrity, edema, etc.): toileted on BSC performing hygiene with set up and CGA for balance;  VSS      Pertinent Vitals/Pain Pain Assessment Pain Assessment: No/denies pain    Home Living  Prior Function            PT Goals (current goals can now be found in the care plan section) Progress towards PT goals: Progressing toward goals    Frequency    Min 2X/week      PT Plan      Co-evaluation              AM-PAC PT 6 Clicks Mobility   Outcome Measure  Help needed turning from your back to your side while in a flat bed without using bedrails?: A Little Help needed moving from lying on your back to  sitting on the side of a flat bed without using bedrails?: A Little Help needed moving to and from a bed to a chair (including a wheelchair)?: A Little Help needed standing up from a chair using your arms (e.g., wheelchair or bedside chair)?: A Little Help needed to walk in hospital room?: A Little Help needed climbing 3-5 steps with a railing? : Total 6 Click Score: 16    End of Session   Activity Tolerance: Patient tolerated treatment well Patient left: in bed;with call bell/phone within reach   PT Visit Diagnosis: Muscle weakness (generalized) (M62.81);Other abnormalities of gait and mobility (R26.89)     Time: 8554-8491 PT Time Calculation (min) (ACUTE ONLY): 23 min  Charges:    $Gait Training: 8-22 mins $Therapeutic Activity: 8-22 mins PT General Charges $$ ACUTE PT VISIT: 1 Visit                     Micheline Portal, PT Acute Rehabilitation Services Office:[phone] 04/04/2024    Montie Portal 04/04/2024, 3:59 PM

## 2024-04-04 NOTE — TOC Initial Note (Signed)
 Transition of Care [facility]) - Initial/Assessment Note    Patient Details  Name: Cynthia [name] MRN: [id number] Date of Birth: 1945/09/30  Transition of Care [facility]) CM/SW Contact:    Khiem Gargis A [place], LCSW Phone Number: 04/04/2024, 3:10 PM  Clinical Narrative:                  Pt is from [facility]. CSW spoke with pt about recommendation for SNF. Pt stated she was agreeable, but  oriented x2; requested CSW reach out to pt's brother Alm for assistance. CSW reached and left VM with contact information to reach back out to CSW.   CSW sent referrals for SNF in meantime and will follow up with both pt and brother to continue SNF discharge plan. Bed offers pending.   CSW will continue to follow.  Expected Discharge Plan: Skilled Nursing Facility Barriers to Discharge: Continued Medical Work up, English as a second language teacher, SNF Pending bed offer   Patient Goals and CMS Choice Patient states their goals for this hospitalization and ongoing recovery are:: ok with Rehab CMS Medicare.gov Compare Post Acute Care list provided to:: Patient Represenative (must comment) (pt's brother, Alm) Choice offered to / list presented to : Sibling      Expected Discharge Plan and Services In-house Referral: Clinical Social Work   Post Acute Care Choice: Skilled Nursing Facility Living arrangements for the past 2 months: Independent Living Facility                                      Prior Living Arrangements/Services Living arrangements for the past 2 months: Independent Living Facility Lives with:: Self          Need for Family Participation in Patient Care: Yes (Comment) Care giver support system in place?: Yes (comment) (pt's brother Alm)      Activities of Daily Living   ADL Screening (condition at time of admission) Independently performs ADLs?: Yes (appropriate for developmental age) Is the patient deaf or have difficulty hearing?: Yes Does the patient have difficulty  seeing, even when wearing glasses/contacts?: Yes Does the patient have difficulty concentrating, remembering, or making decisions?: No  Permission Sought/Granted                  Emotional Assessment Appearance:: Appears stated age Attitude/Demeanor/Rapport: Lethargic Affect (typically observed): Calm Orientation: : Oriented to Self, Oriented to Place Alcohol / Substance Use: Not Applicable Psych Involvement: No (comment)  Admission diagnosis:  Syncope [R55] Injury of lip, initial encounter [S09.93XA] Syncope, unspecified syncope type [R55] Patient Active Problem List   Diagnosis Date Noted   Syncope 04/03/2024   Fall 04/03/2024   Normocytic anemia 04/03/2024   Dementia without behavioral disturbance (HCC) 04/03/2024   Obesity, Class II, BMI 35-39.9, isolated 04/03/2024   Obesity, morbid (HCC) 06/07/2016   Diabetes mellitus type 2, controlled, without complications (HCC) 05/30/2014   Venous (peripheral) insufficiency 05/24/2010   COLONIC POLYPS 05/21/2009   Osteoarthritis 05/21/2009   HYPERCHOLESTEROLEMIA 03/06/2008   Anxiety state 03/06/2008   Essential hypertension 03/06/2008   ASTHMATIC BRONCHITIS, ACUTE 03/06/2008   BACK PAIN, LUMBAR 03/06/2008   HEADACHE 03/06/2008   PCP:  Larnell Hamilton, MD Pharmacy:   [facility] [phone] [place],  - [address] AT [facility] [address] [facility] [address] [place] [address] Phone: [phone] Fax: [phone]     Social Drivers of Health (SDOH) Social History:  SDOH Screenings   Food Insecurity: No Food Insecurity (04/03/2024)  Housing: Low Risk  (04/03/2024)  Transportation Needs: No Transportation Needs (04/03/2024)  Utilities: Not At Risk (04/03/2024)  Social Connections: Patient Declined (04/03/2024)  Tobacco Use: Medium Risk (04/03/2024)   SDOH Interventions:     Readmission Risk Interventions     No data to display

## 2024-04-04 NOTE — NC FL2 (Signed)
 [place]  MEDICAID FL2 LEVEL OF CARE FORM     IDENTIFICATION  Patient Name: Cynthia [name] Birthdate: 11/08/1945 Sex: female Admission Date (Current Location): 04/03/2024  [facility] and [place] Number:  Producer, television/film/video and Address:  [facility], [address], [place], [place] [address]      Provider Number: [id number]  Attending Physician Name and Address:  Mcarthur Pick, MD  Relative Name and Phone Number:       Current Level of Care: Hospital Recommended Level of Care: Skilled Nursing Facility Prior Approval Number:    Date Approved/Denied:   PASRR Number: [id number]  Discharge Plan: SNF    Current Diagnoses: Patient Active Problem List   Diagnosis Date Noted   Syncope 04/03/2024   Fall 04/03/2024   Normocytic anemia 04/03/2024   Dementia without behavioral disturbance (HCC) 04/03/2024   Obesity, Class II, BMI 35-39.9, isolated 04/03/2024   Obesity, morbid (HCC) 06/07/2016   Diabetes mellitus type 2, controlled, without complications (HCC) 05/30/2014   Venous (peripheral) insufficiency 05/24/2010   COLONIC POLYPS 05/21/2009   Osteoarthritis 05/21/2009   HYPERCHOLESTEROLEMIA 03/06/2008   Anxiety state 03/06/2008   Essential hypertension 03/06/2008   ASTHMATIC BRONCHITIS, ACUTE 03/06/2008   BACK PAIN, LUMBAR 03/06/2008   HEADACHE 03/06/2008    Orientation RESPIRATION BLADDER Height & Weight     Self, Place  Normal Continent Weight: 198 lb 10.2 oz (90.1 kg) Height:  5' 2 (157.5 cm)  BEHAVIORAL SYMPTOMS/MOOD NEUROLOGICAL BOWEL NUTRITION STATUS      Continent Diet (see DC summary)  AMBULATORY STATUS COMMUNICATION OF NEEDS Skin   Limited Assist Verbally Normal                       Personal Care Assistance Level of Assistance  Bathing, Feeding, Dressing Bathing Assistance: Limited assistance Feeding assistance: Limited assistance Dressing Assistance: Limited assistance     Functional Limitations Info  Sight, Hearing,  Speech Sight Info: Impaired Hearing Info: Adequate Speech Info: Adequate    SPECIAL CARE FACTORS FREQUENCY  PT (By licensed PT), OT (By licensed OT)     PT Frequency: 5x/week OT Frequency: 5x/week            Contractures Contractures Info: Not present    Additional Factors Info  Code Status, Allergies Code Status Info: FULL Allergies Info: NKA           Current Medications (04/04/2024):  This is the current hospital active medication list Current Facility-Administered Medications  Medication Dose Route Frequency Provider Last Rate Last Admin   acetaminophen  (TYLENOL ) tablet 650 mg  650 mg Oral Q6H PRN Smith, Rondell A, MD   650 mg at 04/03/24 1744   Or   acetaminophen  (TYLENOL ) suppository 650 mg  650 mg Rectal Q6H PRN Smith, Rondell A, MD       albuterol  (PROVENTIL ) (2.5 MG/3ML) 0.083% nebulizer solution 2.5 mg  2.5 mg Nebulization Q6H PRN Smith, Rondell A, MD       amLODipine  (NORVASC ) tablet 5 mg  5 mg Oral Daily Smith, Rondell A, MD   5 mg at 04/04/24 1001   donepezil  (ARICEPT ) tablet 5 mg  5 mg Oral QHS Smith, Rondell A, MD   5 mg at 04/03/24 2215   enoxaparin  (LOVENOX ) injection 40 mg  40 mg Subcutaneous Q24H Smith, Rondell A, MD   40 mg at 04/03/24 2218   famotidine  (PEPCID ) tablet 10 mg  10 mg Oral PRN Claudene Maximino LABOR, MD  insulin  aspart (novoLOG ) injection 0-6 Units  0-6 Units Subcutaneous TID WC Smith, Rondell A, MD       lisinopril  (ZESTRIL ) tablet 40 mg  40 mg Oral Daily Smith, Rondell A, MD   40 mg at 04/04/24 1001   ondansetron  (ZOFRAN ) tablet 4 mg  4 mg Oral Q6H PRN Smith, Rondell A, MD       Or   ondansetron  (ZOFRAN ) injection 4 mg  4 mg Intravenous Q6H PRN Smith, Rondell A, MD       simvastatin  (ZOCOR ) tablet 40 mg  40 mg Oral QHS Smith, Rondell A, MD   40 mg at 04/03/24 2215   sodium chloride  flush (NS) 0.9 % injection 3 mL  3 mL Intravenous Q12H Smith, Rondell A, MD   3 mL at 04/04/24 1001     Discharge Medications: Please see discharge summary  for a list of discharge medications.  Relevant Imaging Results:  Relevant Lab Results:   Additional Information SSN: [id number]  [name] A [place], LCSW

## 2024-04-05 LAB — GLUCOSE, CAPILLARY
Glucose-Capillary: 123 mg/dL — ABNORMAL HIGH (ref 70–99)
Glucose-Capillary: 163 mg/dL — ABNORMAL HIGH (ref 70–99)
Glucose-Capillary: 136 mg/dL — ABNORMAL HIGH (ref 70–99)

## 2024-04-05 MED ORDER — METOPROLOL SUCCINATE ER 25 MG PO TB24: 25.0000 mg | ORAL_TABLET | Freq: Every day | ORAL | 0 refills | Status: AC

## 2024-04-05 NOTE — Plan of Care (Signed)
 Problem: Education: Goal: Knowledge of General Education information will improve Description: Including pain rating scale, medication(s)/side effects and non-pharmacologic comfort measures 04/05/2024 0003 by Jennine Kiki HERO, RN Outcome: Progressing 04/05/2024 0003 by Jennine Kiki HERO, RN Outcome: Progressing   Problem: Health Behavior/Discharge Planning: Goal: Ability to manage health-related needs will improve 04/05/2024 0003 by Jennine Kiki HERO, RN Outcome: Progressing 04/05/2024 0003 by Jennine Kiki HERO, RN Outcome: Progressing   Problem: Clinical Measurements: Goal: Ability to maintain clinical measurements within normal limits will improve 04/05/2024 0003 by Jennine Kiki HERO, RN Outcome: Progressing 04/05/2024 0003 by Jennine Kiki HERO, RN Outcome: Progressing Goal: Will remain free from infection 04/05/2024 0003 by Jennine Kiki HERO, RN Outcome: Progressing 04/05/2024 0003 by Jennine Kiki HERO, RN Outcome: Progressing Goal: Diagnostic test results will improve 04/05/2024 0003 by Jennine Kiki HERO, RN Outcome: Progressing 04/05/2024 0003 by Jennine Kiki HERO, RN Outcome: Progressing Goal: Respiratory complications will improve 04/05/2024 0003 by Jennine Kiki HERO, RN Outcome: Progressing 04/05/2024 0003 by Jennine Kiki HERO, RN Outcome: Progressing Goal: Cardiovascular complication will be avoided 04/05/2024 0003 by Jennine Kiki HERO, RN Outcome: Progressing 04/05/2024 0003 by Jennine Kiki HERO, RN Outcome: Progressing   Problem: Activity: Goal: Risk for activity intolerance will decrease 04/05/2024 0003 by Jennine Kiki HERO, RN Outcome: Progressing 04/05/2024 0003 by Jennine Kiki HERO, RN Outcome: Progressing   Problem: Nutrition: Goal: Adequate nutrition will be maintained 04/05/2024 0003 by Jennine Kiki HERO, RN Outcome: Progressing 04/05/2024 0003 by Jennine Kiki HERO, RN Outcome: Progressing   Problem: Coping: Goal: Level of anxiety will decrease 04/05/2024 0003 by Jennine Kiki HERO, RN Outcome: Progressing 04/05/2024 0003 by Jennine Kiki HERO, RN Outcome: Progressing   Problem: Elimination: Goal: Will not experience complications related to bowel motility 04/05/2024 0003 by Jennine Kiki HERO, RN Outcome: Progressing 04/05/2024 0003 by Jennine Kiki HERO, RN Outcome: Progressing Goal: Will not experience complications related to urinary retention 04/05/2024 0003 by Jennine Kiki HERO, RN Outcome: Progressing 04/05/2024 0003 by Jennine Kiki HERO, RN Outcome: Progressing   Problem: Pain Managment: Goal: General experience of comfort will improve and/or be controlled 04/05/2024 0003 by Jennine Kiki HERO, RN Outcome: Progressing 04/05/2024 0003 by Jennine Kiki HERO, RN Outcome: Progressing   Problem: Safety: Goal: Ability to remain free from injury will improve 04/05/2024 0003 by Jennine Kiki HERO, RN Outcome: Progressing 04/05/2024 0003 by Jennine Kiki HERO, RN Outcome: Progressing   Problem: Skin Integrity: Goal: Risk for impaired skin integrity will decrease 04/05/2024 0003 by Jennine Kiki HERO, RN Outcome: Progressing 04/05/2024 0003 by Jennine Kiki HERO, RN Outcome: Progressing   Problem: Education: Goal: Ability to describe self-care measures that may prevent or decrease complications (Diabetes Survival Skills Education) will improve 04/05/2024 0003 by Jennine Kiki HERO, RN Outcome: Progressing 04/05/2024 0003 by Jennine Kiki HERO, RN Outcome: Progressing Goal: Individualized Educational Video(s) 04/05/2024 0003 by Jennine Kiki HERO, RN Outcome: Progressing 04/05/2024 0003 by Jennine Kiki HERO, RN Outcome: Progressing   Problem: Coping: Goal: Ability to adjust to condition or change in health will improve 04/05/2024 0003 by Jennine Kiki HERO, RN Outcome: Progressing 04/05/2024 0003 by Jennine Kiki HERO, RN Outcome: Progressing   Problem: Fluid Volume: Goal: Ability to maintain a balanced intake and output will improve 04/05/2024 0003 by Jennine Kiki HERO,  RN Outcome: Progressing 04/05/2024 0003 by Jennine Kiki HERO, RN Outcome: Progressing   Problem: Health Behavior/Discharge Planning: Goal: Ability to identify and utilize available resources and services will improve 04/05/2024 0003 by Jennine Kiki HERO, RN Outcome: Progressing 04/05/2024 0003 by  Jennine Kiki HERO, RN Outcome: Progressing Goal: Ability to manage health-related needs will improve Outcome: Progressing   Problem: Metabolic: Goal: Ability to maintain appropriate glucose levels will improve Outcome: Progressing   Problem: Nutritional: Goal: Maintenance of adequate nutrition will improve Outcome: Progressing Goal: Progress toward achieving an optimal weight will improve Outcome: Progressing   Problem: Skin Integrity: Goal: Risk for impaired skin integrity will decrease Outcome: Progressing   Problem: Tissue Perfusion: Goal: Adequacy of tissue perfusion will improve Outcome: Progressing

## 2024-04-05 NOTE — Progress Notes (Signed)
 Occupational Therapy Treatment Patient Details Name: Cynthia [name] MRN: [id number] DOB: [birth year] Today's Date: 04/05/2024   History of present illness Pt is a 78 yo female admitted to [facility] ED on 04/03/24 for syncope and fall at her [facility]. PMH of HTN, DM, anxiety.   OT comments  Patient continues to make good progress with OT treatment with supervision for bed mobility and CGA for functional transfers, toilet transfers and hygiene, and grooming standing at sink. Patient with complaints of dizziness while up with BP 128/57 (57).  Patient will benefit from continued inpatient follow up therapy, <3 hours/day.  Acute OT to continue to follow to address established goals to facilitate DC to next venue of care.        If plan is discharge home, recommend the following:  Assistance with cooking/housework;Assist for transportation;A little help with walking and/or transfers;A little help with bathing/dressing/bathroom   Equipment Recommendations  Tub/shower seat    Recommendations for Other Services      Precautions / Restrictions Precautions Precautions: Fall Recall of Precautions/Restrictions: Impaired Precaution/Restrictions Comments: watch BP Restrictions Weight Bearing Restrictions Per Provider Order: No       Mobility Bed Mobility Overal bed mobility: Needs Assistance Bed Mobility: Supine to Sit     Supine to sit: Supervision     General bed mobility comments: increased time and verbal cues    Transfers Overall transfer level: Needs assistance Equipment used: Rolling walker (2 wheels) Transfers: Sit to/from Stand, Bed to chair/wheelchair/BSC Sit to Stand: Contact guard assist     Step pivot transfers: Contact guard assist     General transfer comment: cues for hand placement and CGA     Balance Overall balance assessment: Needs assistance Sitting-balance support: Feet unsupported Sitting balance-Leahy Scale: Fair Sitting balance - Comments: EOB   Standing balance  support: Single extremity supported, Bilateral upper extremity supported, During functional activity, No upper extremity supported Standing balance-Leahy Scale: Poor Standing balance comment: able to stand at sink with one to no UE support during grooming tasks                           ADL either performed or assessed with clinical judgement   ADL Overall ADL's : Needs assistance/impaired     Grooming: Wash/dry hands;Wash/dry face;Oral care;Contact guard assist;Standing Grooming Details (indicate cue type and reason): required seated rest break following                 Toilet Transfer: Contact guard assist;Regular Toilet;Rolling walker (2 wheels)   Toileting- Clothing Manipulation and Hygiene: Contact guard assist;Sit to/from stand Toileting - Clothing Manipulation Details (indicate cue type and reason): able to perform toilet hygiene while standing            Extremity/Trunk Assessment              Diplomatic Services operational officer Communication Communication: No apparent difficulties   Cognition Arousal: Alert Behavior During Therapy: WFL for tasks assessed/performed Cognition: Cognition impaired   Orientation impairments: Time Awareness: Intellectual awareness intact Memory impairment (select all impairments): Short-term memory Attention impairment (select first level of impairment): Selective attention Executive functioning impairment (select all impairments): Initiation OT - Cognition Comments: unable to give correct day or date.  increased time to follow commands                 Following commands: Intact, Impaired  Following commands impaired: Follows one step commands with increased time      Cueing   Cueing Techniques: Verbal cues  Exercises      Shoulder Instructions       General Comments complaints of dizziness with BP 128/57 (78)    Pertinent Vitals/ Pain       Pain Assessment Pain  Assessment: No/denies pain  Home Living                                          Prior Functioning/Environment              Frequency  Min 2X/week        Progress Toward Goals  OT Goals(current goals can now be found in the care plan section)  Progress towards OT goals: Progressing toward goals  Acute Rehab OT Goals Patient Stated Goal: feel better OT Goal Formulation: With patient Time For Goal Achievement: 04/17/24 Potential to Achieve Goals: Good ADL Goals Pt Will Perform Grooming: with modified independence;standing Pt Will Perform Lower Body Bathing: with modified independence;sit to/from stand Pt Will Perform Lower Body Dressing: with modified independence;sit to/from stand Pt Will Transfer to Toilet: with modified independence;ambulating Pt Will Perform Tub/Shower Transfer: Shower transfer;with modified independence;ambulating  Plan      Co-evaluation                 AM-PAC OT 6 Clicks Daily Activity     Outcome Measure   Help from another person eating meals?: None Help from another person taking care of personal grooming?: A Little Help from another person toileting, which includes using toliet, bedpan, or urinal?: A Little Help from another person bathing (including washing, rinsing, drying)?: A Little Help from another person to put on and taking off regular upper body clothing?: A Little Help from another person to put on and taking off regular lower body clothing?: A Little 6 Click Score: 19    End of Session Equipment Utilized During Treatment: Gait belt;Rolling walker (2 wheels)  OT Visit Diagnosis: Unsteadiness on feet (R26.81);Other abnormalities of gait and mobility (R26.89);Dizziness and giddiness (R42)   Activity Tolerance Patient tolerated treatment well   Patient Left in chair;with call bell/phone within reach;with chair alarm set   Nurse Communication Mobility status;Precautions        Time:  8799-8774 OT Time Calculation (min): 25 min  Charges: OT General Charges $OT Visit: 1 Visit OT Treatments $Self Care/Home Management : 23-37 mins  [name], OTA Acute Rehabilitation Services  Office [phone]   Jeb LITTIE Laine 04/05/2024, 1:12 PM

## 2024-04-05 NOTE — TOC Progression Note (Addendum)
 Transition of Care [facility]) - Progression Note    Patient Details  Name: Cynthia [name] MRN: [id number] Date of Birth: 04/14/1946  [facility] CM/SW Contact  Areebah Meinders A [place], LCSW Phone Number: 04/05/2024, 11:47 AM  Clinical Narrative:     Update 1215 CSW received contact from [facility] at [facility]. She informed CSW that bed is available today or over the weekend for placement. POC is [place] if DC over the weekend versus today.   CSW spoke with pt's brother, Alm and provided bed offers with Medicare.gov ratings. He chose [facility] for SNF placement. CSW started [employer] authorization, status approved. Approval Dates: 04/05/2024-04/09/2024, ID: [id number]   CSW reached out to [facility] to find out if bed is available today or over the weekend for DC, waiting to hear back.  MD notified.   TOC will continue to follow.    Expected Discharge Plan: Skilled Nursing Facility Barriers to Discharge: Continued Medical Work up, English as a second language teacher, SNF Pending bed offer               Expected Discharge Plan and Services In-house Referral: Clinical Social Work   Post Acute Care Choice: Skilled Nursing Facility Living arrangements for the past 2 months: Independent Living Facility                                       Social Drivers of Health (SDOH) Interventions SDOH Screenings   Food Insecurity: No Food Insecurity (04/03/2024)  Housing: Low Risk  (04/03/2024)  Transportation Needs: No Transportation Needs (04/03/2024)  Utilities: Not At Risk (04/03/2024)  Social Connections: Patient Declined (04/03/2024)  Tobacco Use: Medium Risk (04/03/2024)    Readmission Risk Interventions     No data to display

## 2024-04-05 NOTE — TOC Transition Note (Addendum)
 Transition of Care [facility]) - Discharge Note   Patient Details  Name: Cynthia [name] MRN: [id number] Date of Birth: 22-Jun-1946  [facility]) CM/SW Contact:  Deionte Spivack A [place], LCSW Phone Number: 04/05/2024, 2:54 PM   Clinical Narrative:     Patient will DC to: [facility]  Anticipated DC date: 04/05/24  Family notified: Alm Gaskins  Transport by: [facility]  Approval Dates: 04/05/2024-04/09/2024  Auth ID [id number]     Per MD patient ready for DC to [facility]. RN, patient, patient's family, and facility notified of DC. Discharge Summary and FL2 sent to facility. RN to call report prior to discharge [phone], Room 203). DC packet on chart. Ambulance transport requested for patient.     CSW will sign off for now as social work intervention is no longer needed. Please consult us  again if new needs arise.   Final next level of care: Skilled Nursing Facility Barriers to Discharge: Barriers Resolved   Patient Goals and CMS Choice Patient states their goals for this hospitalization and ongoing recovery are:: Wants rehab CMS Medicare.gov Compare Post Acute Care list provided to:: Patient Represenative (must comment) (pt's brother Alm) Choice offered to / list presented to : Sibling      Discharge Placement              Patient chooses bed at: [facility] [place] Patient to be transferred to facility by: [facility] Name of family member notified: Alm Gaskins Patient and family notified of of transfer: 04/05/24  Discharge Plan and Services Additional resources added to the After Visit Summary for   In-house Referral: Clinical Social Work   Post Acute Care Choice: Skilled Nursing Facility                               Social Drivers of Health (SDOH) Interventions SDOH Screenings   Food Insecurity: No Food Insecurity (04/03/2024)  Housing: Low Risk  (04/03/2024)  Transportation Needs: No Transportation Needs (04/03/2024)  Utilities: Not At Risk (04/03/2024)   Social Connections: Patient Declined (04/03/2024)  Tobacco Use: Medium Risk (04/03/2024)     Readmission Risk Interventions     No data to display

## 2024-04-05 NOTE — Discharge Summary (Signed)
 Physician Discharge Summary   Patient: Cynthia [name] MRN: [id number] DOB: [birth year]  Admit date:     04/03/2024  Discharge date: 04/05/24  Discharge Physician: Derryl Duval   PCP: Larnell Hamilton, MD   Recommendations at discharge:    Discharge Diagnoses: Principal Problem:   Syncope Active Problems:   Fall   Normocytic anemia   Essential hypertension   Diabetes mellitus type 2, controlled, without complications (HCC)   Dementia without behavioral disturbance (HCC)   Obesity, Class II, BMI 35-39.9, isolated  Resolved Problems:   * No resolved hospital problems. *  Hospital Course:   78 year old female with past medical history significant for hypertension, hyperlipidemia, diabetes mellitus type 2, morbid obesity, worsening dementia, recurrent syncope who presented to the hospital after having unwitnessed fall.  She has been having increasing memory since she was lately and takes donepezil  the dose of which was increased lately.  Patient is usually alert and oriented to self and place but sometimes loses track of the day.  Patient experienced a fall after getting up to use the bathroom at her facility although she does not recall the event.  She was found on the floor in brought to the hospital for evaluation.  Of note, patient had recurrent syncopal episodes in the past and was seen by cardiology as well as neurology.  Felt to be vasovagal in the past.  Workup in the past were negative.  In the emergency department she was afebrile with with a stable blood pressure.  Of note, en route to the ER she was apparently in rapid A-fib and was given 10 mg IV Cardizem  with reversal to sinus rhythm.  There is no known history of A-fib in the past.  CT scan of the head as well as cervical spine were negative for acute findings, chest as well as pelvis x-ray did not reveal any acute abnormality.  Patient was admitted for further evaluation and management.  MRI of the brain was completed  and was negative for acute stroke or other significant abnormalities.  Revealed mild parenchymal volume loss and microangiopathic ischemic changes. Patient did not have any syncopal episodes in the hospital. She was evaluated by PT/OT and recommended rehab.  Transferred to SNF.   Assessment and Plan:  Syncope/fall   - Workup including CT head, CT cervical neck, MRI brain, echocardiogram are all negative. -Reported rapid A-fib en route to ER, resolved with IV Cardizem  prior to ER. -No known prior history of A-fib, patient is not anticoagulated given recurrent falls. -Could potentially get Holter monitor. -Evaluated by PT/OT, recommending SNF.   Normocytic anemia Hemoglobin 11.6-> 10.5.  No reports of bleeding   Essential hypertension On lisinopril , amlodipine  and hydrochlorothiazide .  Will DC amlodipine  and start metoprolol  25 mg daily for rate control.    Controlled diabetes mellitus type 2, without long-term use of insulin  Continue metformin  Hemoglobin A1c is low at 5.8  Dementia Patient has poor recent memory, but family notes that she is able to recognize them and usually is alert to person and most of the time place. - Continue Aricept  -Recommend neurology elevation outpatient.  Dental caries Patient has a left molar that has posterior decay - Recommend outpatient follow-up with dentistry   Obesity, class II BMI 36.33 kg/m      Consultants:  Procedures performed:   Disposition: Skilled nursing facility Diet recommendation:  Discharge Diet Orders (From admission, onward)     Start     Ordered   04/05/24 0000  Diet -  low sodium heart healthy        04/05/24 1235           Carb modified diet DISCHARGE MEDICATION: Allergies as of 04/05/2024   No Known Allergies      Medication List     STOP taking these medications    amLODipine  5 MG tablet Commonly known as: NORVASC        TAKE these medications    aspirin EC 81 MG tablet Take 81 mg by  mouth at bedtime.   cholecalciferol 1000 units tablet Commonly known as: VITAMIN D  Take 2,000 Units by mouth daily.   cyanocobalamin 1000 MCG tablet Commonly known as: VITAMIN B12 Take 1,000 mcg by mouth daily.   donepezil  23 MG Tabs tablet Commonly known as: ARICEPT  Take 23 mg by mouth at bedtime.   famotidine  10 MG tablet Commonly known as: PEPCID  Take 10 mg by mouth as needed.   ferrous sulfate 325 (65 FE) MG tablet Take 325 mg by mouth daily with breakfast.   hydrochlorothiazide  25 MG tablet Commonly known as: HYDRODIURIL  Take 25 mg by mouth daily.   icosapent  Ethyl 1 g capsule Commonly known as: Vascepa  Take 2 capsules (2 g total) by mouth 2 (two) times daily.   lisinopril  40 MG tablet Commonly known as: ZESTRIL  Take 40 mg by mouth daily.   metFORMIN  1000 MG tablet Commonly known as: GLUCOPHAGE  Take 1,000 mg by mouth 2 (two) times daily with a meal.   metoprolol  succinate 25 MG 24 hr tablet Commonly known as: Toprol  XL Take 1 tablet (25 mg total) by mouth daily.   metoprolol  tartrate 100 MG tablet Commonly known as: Lopressor  Take 1 tablet (100 mg total) by mouth once for 1 dose. Take 90-120 minutes prior to scan.   MULTIPLE VITAMINS/WOMENS PO Take 1 tablet by mouth daily.   naproxen sodium 220 MG tablet Commonly known as: ALEVE Take 440 mg by mouth daily as needed (back pain).   Potassium 99 MG Tabs Take 1 tablet by mouth daily.   simvastatin  40 MG tablet Commonly known as: ZOCOR  Take 1 tablet (40 mg total) by mouth at bedtime.   TUMS E-X PO Take 1 tablet by mouth daily as needed (acid reflux).        Contact information for follow-up providers     Larnell Hamilton, MD Follow up in 1 week(s).   Specialty: Internal Medicine Contact information: [address] [place] [address] [phone]         [facility] Follow up in 4 week(s).   Contact information: [address]     [address] [place]  [place] [address] [phone]             Contact information for after-discharge care     Destination     [facility], [place] .   Service: Skilled Nursing Contact information: [address] [place]  [phone]                    Discharge Exam: Fredricka Weights   04/03/24 0650  Weight: 90.1 kg     Condition at discharge: fair  The results of significant diagnostics from this hospitalization (including imaging, microbiology, ancillary and laboratory) are listed below for reference.   Imaging Studies: DG Foot 2 Views Right Result Date: 04/04/2024 CLINICAL DATA:  Fall and toe swelling. EXAM: RIGHT FOOT - 2 VIEW COMPARISON:  None Available. FINDINGS: There is no acute fracture or dislocation. The bones  are osteopenic. Evaluation of the toes is limited due to osteopenia and hammertoe deformity. The soft tissues are unremarkable. IMPRESSION: No acute fracture or dislocation. Electronically Signed   By: Vanetta Chou M.D.   On: 04/04/2024 19:59   ECHOCARDIOGRAM COMPLETE Result Date: 04/03/2024    ECHOCARDIOGRAM REPORT   Patient Name:   Cynthia [name] Date of Exam: 04/03/2024 Medical Rec #:  [id number]    Height:       62.0 in Accession #:    [id number]   Weight:       198.6 lb Date of Birth:  May 18, 1946     BSA:          1.906 m Patient Age:    78 years     BP:           122/95 mmHg Patient Gender: F            HR:           68 bpm. Exam Location:  Inpatient Procedure: 2D Echo, Cardiac Doppler, Color Doppler and Intracardiac            Opacification Agent (Both Spectral and Color Flow Doppler were            utilized during procedure). Indications:    Syncope  History:        Patient has prior history of Echocardiogram examinations, most                 recent 12/16/2019. Risk Factors:Hypertension, Diabetes, Morbid                 Obesity and Dyslipidemia.  Sonographer:    Therisa Crouch Referring Phys: [id number] RONDELL A SMITH IMPRESSIONS   1. Left ventricular ejection fraction, by estimation, is 65 to 70%. The left ventricle has normal function. The left ventricle has no regional wall motion abnormalities. There is mild left ventricular hypertrophy. Left ventricular diastolic parameters are consistent with Grade I diastolic dysfunction (impaired relaxation).  2. Right ventricular systolic function is normal. The right ventricular size is normal. There is normal pulmonary artery systolic pressure. The estimated right ventricular systolic pressure is 18.5 mmHg.  3. The mitral valve is normal in structure. Trivial mitral valve regurgitation. No evidence of mitral stenosis.  4. The aortic valve was not well visualized. Aortic valve regurgitation is not visualized. No aortic stenosis is present.  5. The inferior vena cava is normal in size with greater than 50% respiratory variability, suggesting right atrial pressure of 3 mmHg. FINDINGS  Left Ventricle: Left ventricular ejection fraction, by estimation, is 65 to 70%. The left ventricle has normal function. The left ventricle has no regional wall motion abnormalities. The left ventricular internal cavity size was normal in size. There is  mild left ventricular hypertrophy. Left ventricular diastolic parameters are consistent with Grade I diastolic dysfunction (impaired relaxation). Right Ventricle: The right ventricular size is normal. No increase in right ventricular wall thickness. Right ventricular systolic function is normal. There is normal pulmonary artery systolic pressure. The tricuspid regurgitant velocity is 1.97 m/s, and  with an assumed right atrial pressure of 3 mmHg, the estimated right ventricular systolic pressure is 18.5 mmHg. Left Atrium: Left atrial size was normal in size. Right Atrium: Right atrial size was normal in size. Pericardium: There is no evidence of pericardial effusion. Mitral Valve: The mitral valve is normal in structure. Trivial mitral valve regurgitation. No evidence of  mitral valve stenosis. Tricuspid Valve: The tricuspid valve is normal in  structure. Tricuspid valve regurgitation is trivial. Aortic Valve: The aortic valve was not well visualized. Aortic valve regurgitation is not visualized. No aortic stenosis is present. Aortic valve mean gradient measures 6.0 mmHg. Aortic valve peak gradient measures 12.0 mmHg. Aortic valve area, by VTI measures 1.59 cm. Pulmonic Valve: The pulmonic valve was not well visualized. Pulmonic valve regurgitation is trivial. Aorta: The aortic root and ascending aorta are structurally normal, with no evidence of dilitation. Venous: The inferior vena cava is normal in size with greater than 50% respiratory variability, suggesting right atrial pressure of 3 mmHg. IAS/Shunts: The interatrial septum was not well visualized.  LEFT VENTRICLE PLAX 2D LVIDd:         3.80 cm   Diastology LVIDs:         2.60 cm   LV e' medial:    9.32 cm/s LV PW:         1.10 cm   LV E/e' medial:  8.2 LV IVS:        0.80 cm   LV e' lateral:   7.46 cm/s LVOT diam:     1.80 cm   LV E/e' lateral: 10.2 LV SV:         52 LV SV Index:   27 LVOT Area:     2.54 cm  RIGHT VENTRICLE             IVC RV Basal diam:  2.70 cm     IVC diam: 1.70 cm RV S prime:     14.60 cm/s TAPSE (M-mode): 2.6 cm LEFT ATRIUM             Index LA diam:        3.20 cm 1.68 cm/m LA Vol (A2C):   22.7 ml 11.91 ml/m LA Vol (A4C):   28.1 ml 14.74 ml/m LA Biplane Vol: 26.8 ml 14.06 ml/m  AORTIC VALVE AV Area (Vmax):    1.60 cm AV Area (Vmean):   1.58 cm AV Area (VTI):     1.59 cm AV Vmax:           173.00 cm/s AV Vmean:          112.000 cm/s AV VTI:            0.327 m AV Peak Grad:      12.0 mmHg AV Mean Grad:      6.0 mmHg LVOT Vmax:         109.00 cm/s LVOT Vmean:        69.500 cm/s LVOT VTI:          0.204 m LVOT/AV VTI ratio: 0.62  AORTA Ao Root diam: 2.80 cm Ao Asc diam:  3.00 cm MITRAL VALVE                TRICUSPID VALVE MV Area (PHT): 2.90 cm     TR Peak grad:   15.5 mmHg MV Decel Time: 262 msec      TR Vmax:        197.00 cm/s MV E velocity: 76.40 cm/s MV A velocity: 101.00 cm/s  SHUNTS MV E/A ratio:  0.76         Systemic VTI:  0.20 m                             Systemic Diam: 1.80 cm Lonni Nanas MD Electronically signed by Lonni Nanas MD Signature Date/Time: 04/03/2024/2:45:38 PM    Final  MR BRAIN WO CONTRAST Result Date: 04/03/2024 EXAM: MRI BRAIN WITHOUT CONTRAST 04/03/2024 12:39:00 PM TECHNIQUE: Multiplanar multisequence MRI of the head/brain was performed without the administration of intravenous contrast. COMPARISON: Same day CT head and MRI head 131 22. CLINICAL HISTORY: Stroke, follow up. Dizziness with fall today. Concern for stroke. FINDINGS: BRAIN AND VENTRICLES: No acute infarct. No intracranial hemorrhage. No mass. No midline shift. No hydrocephalus. The sella is unremarkable. Normal flow voids. Similar appearance of prominent perivascular spaces in the inferior aspect of the basal ganglia. Mild parenchymal volume loss. There are nonspecific hyperintense foci in the subcortical and periventricular white matter that most likely represent chronic microangiopathic ischemic changes in a patient of this age. ORBITS: Lateral lens replacement elongation of the globes with abnormal posterior contour suggestive of axial myopia. SINUSES AND MASTOIDS: No acute abnormality. BONES AND SOFT TISSUES: Normal marrow signal. No acute soft tissue abnormality. IMPRESSION: 1. No acute intracranial abnormality. 2. Mild parenchymal volume loss and chronic microangiopathic ischemic changes. Electronically signed by: Donnice Mania MD 04/03/2024 01:39 PM EDT RP Workstation: [id number]   DG Pelvis Portable Result Date: 04/03/2024 CLINICAL DATA:  Unwitnessed fall. EXAM: PORTABLE PELVIS 1-2 VIEWS COMPARISON:  None Available. FINDINGS: There is no evidence of pelvic fracture or diastasis. No pelvic bone lesions are seen. IMPRESSION: Negative. Electronically Signed   By: Lynwood Landy Raddle M.D.   On:  04/03/2024 07:49   DG Chest Port 1 View Result Date: 04/03/2024 CLINICAL DATA:  Unwitnessed fall. EXAM: PORTABLE CHEST 1 VIEW COMPARISON:  June 07, 2016. FINDINGS: The heart size and mediastinal contours are within normal limits. Both lungs are clear. The visualized skeletal structures are unremarkable. IMPRESSION: No active disease. Electronically Signed   By: Lynwood Landy Raddle M.D.   On: 04/03/2024 07:48   CT HEAD WO CONTRAST Result Date: 04/03/2024 CLINICAL DATA:  Unwitnessed fall. The patient was found down in her facility and told staff she probably had a syncopal episode but does not remember it. Blunt head and face trauma with neck pain. EXAM: CT HEAD WITHOUT CONTRAST CT MAXILLOFACIAL WITHOUT CONTRAST CT CERVICAL SPINE WITHOUT CONTRAST TECHNIQUE: Multidetector CT imaging of the head, cervical spine, and maxillofacial structures were performed using the standard protocol without intravenous contrast. Multiplanar CT image reconstructions of the cervical spine and maxillofacial structures were also generated. RADIATION DOSE REDUCTION: This exam was performed according to the departmental dose-optimization program which includes automated exposure control, adjustment of the mA and/or kV according to patient size and/or use of iterative reconstruction technique. COMPARISON:  MRI brain 08/31/2020, CT scan face and cervical spine both 06/02/2015. FINDINGS: CT HEAD FINDINGS Brain: There is mild to moderate cerebral and mild cerebellar atrophy, with mild small vessel disease of the cerebral white matter and atrophic ventriculomegaly. There are chronic perivascular spaces in the basal ganglia. No cortical based acute infarct, hemorrhage, mass or mass effect are seen. There is no midline shift.  The basal cisterns are clear. Vascular: Calcific plaques both siphons, both distal vertebral arteries. No hyperdense central vessel is seen. Skull: Negative for fractures or focal lesions. Other: None. CT MAXILLOFACIAL  FINDINGS Osseous: A displaced chronic fracture is again noted of the left maxillary second bicuspid tooth. There are periapical lucency along the root structure of this tooth as well as of the 12 year left maxillary molar, which again demonstrates posterior decay. No acute dental abnormality is seen by follow-up with a dentist is recommended. Both mandibular third molars are chronically impacted. No facial bone fracture is seen. No  mandibular fracture or dislocation. Orbits: The bony orbits and globes are intact. No intraorbital mass, hematoma or fluid collection is seen. Old lens replacements are again noted as well as bilateral dorsal uveoscleral staphylomas. Sinuses: There is chronic membrane thickening in the floor of the left maxillary sinus. Mild chronic membrane thickening in the ethmoids. Other paranasal sinuses, the bilateral mastoid air cells, and bilateral middle ears are clear. Nasal septum is midline. Both ostiomeatal complexes are patent. There are intact nasal turbinates. Soft tissues: No facial hematoma or mass. Right parotid gland is slightly larger than the left but this was seen in 2016 as well. Both parotid glands show fatty infiltration as before. CT CERVICAL SPINE FINDINGS Alignment: There is a chronic reversed lordosis, chronic degenerative grade 1 anterolisthesis at C3-4. Again also noted is bone-on-bone anterior atlantodental joint space loss with osteophytes. No new or traumatic alignment abnormality is seen, no other listhesis. Skull base and vertebrae: No acute fracture is evident. There is osteopenia. No primary bone lesion or focal pathologic process. Soft tissues and spinal canal: No prevertebral fluid or swelling. No visible canal hematoma. There is a heterogeneous 1.9 cm nodule in the posterior aspect of the right lobe of the thyroid  gland, previously 1.6 cm. Nonemergent follow-up thyroid  ultrasound is recommended. There are moderate to heavy calcifications in both proximal cervical  ICAs. No laryngeal mass. Disc levels: The disc heights are only normal at C2-3 and C7-T1, with disc space loss at all other intervening levels with greatest disc collapse at C5-6 and C6-7. There are bidirectional endplate spurs at these levels as well, but no high-grade encroachment on the spinal canal is seen. No overt spondylotic cord compression. The C2-3 facet joints are chronically ankylosed. There is facet and uncinate joint hypertrophy causing multilevel acquired foraminal stenosis. The greatest foraminal stenosis is on the right at C3-4, and on the right greater than left C5-6 with moderate foraminal stenosis C6-7. Upper chest: Negative. Other: None. IMPRESSION: 1. No acute intracranial CT findings or depressed skull fractures. 2. Atrophy and small-vessel disease. 3. No evidence of facial bone fractures. 4. Chronic fracture of the left maxillary second bicuspid tooth with pronounced periapical lucency along the root structure of this tooth as well as of the 12 year left maxillary molar, which again demonstrates posterior decay. Follow-up with a dentist is recommended. 5. 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 6. 1.9 cm heterogeneous nodule in the right lobe of the thyroid  gland, previously 1.6 cm. Nonemergent follow-up ultrasound recommended. 7. Moderate to heavy calcifications in both proximal cervical ICAs. Electronically Signed   By: Francis Quam M.D.   On: 04/03/2024 07:47   CT CERVICAL SPINE WO CONTRAST Result Date: 04/03/2024 CLINICAL DATA:  Unwitnessed fall. The patient was found down in her facility and told staff she probably had a syncopal episode but does not remember it. Blunt head and face trauma with neck pain. EXAM: CT HEAD WITHOUT CONTRAST CT MAXILLOFACIAL WITHOUT CONTRAST CT CERVICAL SPINE WITHOUT CONTRAST TECHNIQUE: Multidetector CT imaging of the head, cervical spine, and maxillofacial structures were  performed using the standard protocol without intravenous contrast. Multiplanar CT image reconstructions of the cervical spine and maxillofacial structures were also generated. RADIATION DOSE REDUCTION: This exam was performed according to the departmental dose-optimization program which includes automated exposure control, adjustment of the mA and/or kV according to patient size and/or use of iterative reconstruction technique. COMPARISON:  MRI brain 08/31/2020, CT scan face and cervical spine both  06/02/2015. FINDINGS: CT HEAD FINDINGS Brain: There is mild to moderate cerebral and mild cerebellar atrophy, with mild small vessel disease of the cerebral white matter and atrophic ventriculomegaly. There are chronic perivascular spaces in the basal ganglia. No cortical based acute infarct, hemorrhage, mass or mass effect are seen. There is no midline shift.  The basal cisterns are clear. Vascular: Calcific plaques both siphons, both distal vertebral arteries. No hyperdense central vessel is seen. Skull: Negative for fractures or focal lesions. Other: None. CT MAXILLOFACIAL FINDINGS Osseous: A displaced chronic fracture is again noted of the left maxillary second bicuspid tooth. There are periapical lucency along the root structure of this tooth as well as of the 12 year left maxillary molar, which again demonstrates posterior decay. No acute dental abnormality is seen by follow-up with a dentist is recommended. Both mandibular third molars are chronically impacted. No facial bone fracture is seen. No mandibular fracture or dislocation. Orbits: The bony orbits and globes are intact. No intraorbital mass, hematoma or fluid collection is seen. Old lens replacements are again noted as well as bilateral dorsal uveoscleral staphylomas. Sinuses: There is chronic membrane thickening in the floor of the left maxillary sinus. Mild chronic membrane thickening in the ethmoids. Other paranasal sinuses, the bilateral mastoid air  cells, and bilateral middle ears are clear. Nasal septum is midline. Both ostiomeatal complexes are patent. There are intact nasal turbinates. Soft tissues: No facial hematoma or mass. Right parotid gland is slightly larger than the left but this was seen in 2016 as well. Both parotid glands show fatty infiltration as before. CT CERVICAL SPINE FINDINGS Alignment: There is a chronic reversed lordosis, chronic degenerative grade 1 anterolisthesis at C3-4. Again also noted is bone-on-bone anterior atlantodental joint space loss with osteophytes. No new or traumatic alignment abnormality is seen, no other listhesis. Skull base and vertebrae: No acute fracture is evident. There is osteopenia. No primary bone lesion or focal pathologic process. Soft tissues and spinal canal: No prevertebral fluid or swelling. No visible canal hematoma. There is a heterogeneous 1.9 cm nodule in the posterior aspect of the right lobe of the thyroid  gland, previously 1.6 cm. Nonemergent follow-up thyroid  ultrasound is recommended. There are moderate to heavy calcifications in both proximal cervical ICAs. No laryngeal mass. Disc levels: The disc heights are only normal at C2-3 and C7-T1, with disc space loss at all other intervening levels with greatest disc collapse at C5-6 and C6-7. There are bidirectional endplate spurs at these levels as well, but no high-grade encroachment on the spinal canal is seen. No overt spondylotic cord compression. The C2-3 facet joints are chronically ankylosed. There is facet and uncinate joint hypertrophy causing multilevel acquired foraminal stenosis. The greatest foraminal stenosis is on the right at C3-4, and on the right greater than left C5-6 with moderate foraminal stenosis C6-7. Upper chest: Negative. Other: None. IMPRESSION: 1. No acute intracranial CT findings or depressed skull fractures. 2. Atrophy and small-vessel disease. 3. No evidence of facial bone fractures. 4. Chronic fracture of the left  maxillary second bicuspid tooth with pronounced periapical lucency along the root structure of this tooth as well as of the 12 year left maxillary molar, which again demonstrates posterior decay. Follow-up with a dentist is recommended. 5. 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 6. 1.9 cm heterogeneous nodule in the right lobe of the thyroid  gland, previously 1.6 cm. Nonemergent follow-up ultrasound recommended. 7. Moderate to heavy calcifications in both proximal  cervical ICAs. Electronically Signed   By: Francis Quam M.D.   On: 04/03/2024 07:47   CT MAXILLOFACIAL WO CONTRAST Result Date: 04/03/2024 CLINICAL DATA:  Unwitnessed fall. The patient was found down in her facility and told staff she probably had a syncopal episode but does not remember it. Blunt head and face trauma with neck pain. EXAM: CT HEAD WITHOUT CONTRAST CT MAXILLOFACIAL WITHOUT CONTRAST CT CERVICAL SPINE WITHOUT CONTRAST TECHNIQUE: Multidetector CT imaging of the head, cervical spine, and maxillofacial structures were performed using the standard protocol without intravenous contrast. Multiplanar CT image reconstructions of the cervical spine and maxillofacial structures were also generated. RADIATION DOSE REDUCTION: This exam was performed according to the departmental dose-optimization program which includes automated exposure control, adjustment of the mA and/or kV according to patient size and/or use of iterative reconstruction technique. COMPARISON:  MRI brain 08/31/2020, CT scan face and cervical spine both 06/02/2015. FINDINGS: CT HEAD FINDINGS Brain: There is mild to moderate cerebral and mild cerebellar atrophy, with mild small vessel disease of the cerebral white matter and atrophic ventriculomegaly. There are chronic perivascular spaces in the basal ganglia. No cortical based acute infarct, hemorrhage, mass or mass effect are seen. There is no  midline shift.  The basal cisterns are clear. Vascular: Calcific plaques both siphons, both distal vertebral arteries. No hyperdense central vessel is seen. Skull: Negative for fractures or focal lesions. Other: None. CT MAXILLOFACIAL FINDINGS Osseous: A displaced chronic fracture is again noted of the left maxillary second bicuspid tooth. There are periapical lucency along the root structure of this tooth as well as of the 12 year left maxillary molar, which again demonstrates posterior decay. No acute dental abnormality is seen by follow-up with a dentist is recommended. Both mandibular third molars are chronically impacted. No facial bone fracture is seen. No mandibular fracture or dislocation. Orbits: The bony orbits and globes are intact. No intraorbital mass, hematoma or fluid collection is seen. Old lens replacements are again noted as well as bilateral dorsal uveoscleral staphylomas. Sinuses: There is chronic membrane thickening in the floor of the left maxillary sinus. Mild chronic membrane thickening in the ethmoids. Other paranasal sinuses, the bilateral mastoid air cells, and bilateral middle ears are clear. Nasal septum is midline. Both ostiomeatal complexes are patent. There are intact nasal turbinates. Soft tissues: No facial hematoma or mass. Right parotid gland is slightly larger than the left but this was seen in 2016 as well. Both parotid glands show fatty infiltration as before. CT CERVICAL SPINE FINDINGS Alignment: There is a chronic reversed lordosis, chronic degenerative grade 1 anterolisthesis at C3-4. Again also noted is bone-on-bone anterior atlantodental joint space loss with osteophytes. No new or traumatic alignment abnormality is seen, no other listhesis. Skull base and vertebrae: No acute fracture is evident. There is osteopenia. No primary bone lesion or focal pathologic process. Soft tissues and spinal canal: No prevertebral fluid or swelling. No visible canal hematoma. There is a  heterogeneous 1.9 cm nodule in the posterior aspect of the right lobe of the thyroid  gland, previously 1.6 cm. Nonemergent follow-up thyroid  ultrasound is recommended. There are moderate to heavy calcifications in both proximal cervical ICAs. No laryngeal mass. Disc levels: The disc heights are only normal at C2-3 and C7-T1, with disc space loss at all other intervening levels with greatest disc collapse at C5-6 and C6-7. There are bidirectional endplate spurs at these levels as well, but no high-grade encroachment on the spinal canal is seen. No overt spondylotic cord compression. The  C2-3 facet joints are chronically ankylosed. There is facet and uncinate joint hypertrophy causing multilevel acquired foraminal stenosis. The greatest foraminal stenosis is on the right at C3-4, and on the right greater than left C5-6 with moderate foraminal stenosis C6-7. Upper chest: Negative. Other: None. IMPRESSION: 1. No acute intracranial CT findings or depressed skull fractures. 2. Atrophy and small-vessel disease. 3. No evidence of facial bone fractures. 4. Chronic fracture of the left maxillary second bicuspid tooth with pronounced periapical lucency along the root structure of this tooth as well as of the 12 year left maxillary molar, which again demonstrates posterior decay. Follow-up with a dentist is recommended. 5. 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 6. 1.9 cm heterogeneous nodule in the right lobe of the thyroid  gland, previously 1.6 cm. Nonemergent follow-up ultrasound recommended. 7. Moderate to heavy calcifications in both proximal cervical ICAs. Electronically Signed   By: Francis Quam M.D.   On: 04/03/2024 07:47    Microbiology: No results found for this or any previous visit.  Labs: CBC: Recent Labs  Lab 04/03/24 0655 04/03/24 0703 04/03/24 1420 04/04/24 0208  WBC 7.7  --   --  7.8  HGB 11.6* 10.5* 12.0 12.0   HCT 34.4* 31.0* 35.1* 35.6*  MCV 94.8  --   --  94.9  PLT 184  --   --  186   Basic Metabolic Panel: Recent Labs  Lab 04/03/24 0655 04/03/24 0703 04/04/24 0208  NA 135 133* 136  K 3.6 3.6 3.6  CL 97* 98 98  CO2 23  --  20*  GLUCOSE 159* 158* 117*  BUN 19 21 11   CREATININE 1.00 1.10* 0.80  CALCIUM 9.0  --  9.4   Liver Function Tests: Recent Labs  Lab 04/03/24 0655  AST 29  ALT 23  ALKPHOS 40  BILITOT 0.9  PROT 6.0*  ALBUMIN 3.5   CBG: Recent Labs  Lab 04/04/24 1553 04/04/24 2041 04/05/24 0654 04/05/24 0735 04/05/24 1244  GLUCAP 97 124* 123* 163* 136*    Discharge time spent: greater than 30 minutes.  Signed: Derryl Duval, MD [facility] 04/05/2024

## 2024-04-05 NOTE — Progress Notes (Signed)
 OT Cancellation Note  Patient Details Name: Cynthia [name] MRN: [id number] DOB: [birth year]   Cancelled Treatment:    Reason Eval/Treat Not Completed: Patient declined, no reason specified (Patient declined OT this am stating she did not feel well but could not specify.  OT to reattempt later today as schedule permits)  Jeb LITTIE Laine 04/05/2024, 8:05 AM  [name], OTA [facility]  Office [phone]

## 2024-09-05 ENCOUNTER — Observation Stay (HOSPITAL_COMMUNITY)

## 2024-09-05 ENCOUNTER — Emergency Department (HOSPITAL_COMMUNITY)

## 2024-09-05 ENCOUNTER — Observation Stay (HOSPITAL_COMMUNITY)
Admission: EM | Admit: 2024-09-05 | Source: Home / Self Care | Attending: Emergency Medicine | Admitting: Emergency Medicine

## 2024-09-05 ENCOUNTER — Encounter (HOSPITAL_COMMUNITY): Payer: Self-pay | Admitting: Emergency Medicine

## 2024-09-05 ENCOUNTER — Other Ambulatory Visit: Payer: Self-pay

## 2024-09-05 DIAGNOSIS — E785 Hyperlipidemia, unspecified: Secondary | ICD-10-CM | POA: Diagnosis present

## 2024-09-05 DIAGNOSIS — G9340 Encephalopathy, unspecified: Principal | ICD-10-CM | POA: Diagnosis present

## 2024-09-05 DIAGNOSIS — I10 Essential (primary) hypertension: Secondary | ICD-10-CM | POA: Diagnosis present

## 2024-09-05 DIAGNOSIS — D61818 Other pancytopenia: Secondary | ICD-10-CM | POA: Diagnosis present

## 2024-09-05 DIAGNOSIS — E66812 Obesity, class 2: Secondary | ICD-10-CM | POA: Diagnosis present

## 2024-09-05 DIAGNOSIS — E119 Type 2 diabetes mellitus without complications: Secondary | ICD-10-CM | POA: Diagnosis not present

## 2024-09-05 DIAGNOSIS — G8194 Hemiplegia, unspecified affecting left nondominant side: Secondary | ICD-10-CM

## 2024-09-05 DIAGNOSIS — I671 Cerebral aneurysm, nonruptured: Secondary | ICD-10-CM | POA: Diagnosis not present

## 2024-09-05 DIAGNOSIS — G9341 Metabolic encephalopathy: Secondary | ICD-10-CM | POA: Diagnosis present

## 2024-09-05 DIAGNOSIS — F0390 Unspecified dementia without behavioral disturbance: Secondary | ICD-10-CM | POA: Diagnosis present

## 2024-09-05 DIAGNOSIS — R55 Syncope and collapse: Secondary | ICD-10-CM | POA: Diagnosis present

## 2024-09-05 LAB — CBC WITH DIFFERENTIAL/PLATELET
Abs Immature Granulocytes: 0.04 10*3/uL (ref 0.00–0.07)
Basophils Absolute: 0 10*3/uL (ref 0.0–0.1)
Basophils Relative: 0 %
Eosinophils Absolute: 0.1 10*3/uL (ref 0.0–0.5)
Eosinophils Relative: 1 %
HCT: 33.1 % — ABNORMAL LOW (ref 36.0–46.0)
Hemoglobin: 11 g/dL — ABNORMAL LOW (ref 12.0–15.0)
Immature Granulocytes: 1 %
Lymphocytes Relative: 2 %
Lymphs Abs: 0.2 10*3/uL — ABNORMAL LOW (ref 0.7–4.0)
MCH: 30.9 pg (ref 26.0–34.0)
MCHC: 33.2 g/dL (ref 30.0–36.0)
MCV: 93 fL (ref 80.0–100.0)
Monocytes Absolute: 0.3 10*3/uL (ref 0.1–1.0)
Monocytes Relative: 3 %
Neutro Abs: 8.2 10*3/uL — ABNORMAL HIGH (ref 1.7–7.7)
Neutrophils Relative %: 93 %
Platelets: 121 10*3/uL — ABNORMAL LOW (ref 150–400)
RBC: 3.56 MIL/uL — ABNORMAL LOW (ref 3.87–5.11)
RDW: 13.1 % (ref 11.5–15.5)
WBC: 8.8 10*3/uL (ref 4.0–10.5)
nRBC: 0 % (ref 0.0–0.2)

## 2024-09-05 LAB — URINE DRUG SCREEN
Amphetamines: NEGATIVE
Barbiturates: NEGATIVE
Benzodiazepines: NEGATIVE
Cocaine: NEGATIVE
Fentanyl: NEGATIVE
Methadone Scn, Ur: NEGATIVE
Opiates: NEGATIVE
Tetrahydrocannabinol: NEGATIVE

## 2024-09-05 LAB — COMPREHENSIVE METABOLIC PANEL WITH GFR
ALT: 23 U/L (ref 0–44)
AST: 33 U/L (ref 15–41)
Albumin: 3.6 g/dL (ref 3.5–5.0)
Alkaline Phosphatase: 69 U/L (ref 38–126)
Anion gap: 11 (ref 5–15)
BUN: 14 mg/dL (ref 8–23)
CO2: 24 mmol/L (ref 22–32)
Calcium: 8.9 mg/dL (ref 8.9–10.3)
Chloride: 101 mmol/L (ref 98–111)
Creatinine, Ser: 0.62 mg/dL (ref 0.44–1.00)
GFR, Estimated: >60 mL/min
Glucose, Bld: 160 mg/dL — ABNORMAL HIGH (ref 70–99)
Potassium: 3.6 mmol/L (ref 3.5–5.1)
Sodium: 136 mmol/L (ref 135–145)
Total Bilirubin: 0.3 mg/dL (ref 0.0–1.2)
Total Protein: 5.6 g/dL — ABNORMAL LOW (ref 6.5–8.1)

## 2024-09-05 LAB — URINALYSIS, ROUTINE W REFLEX MICROSCOPIC
Bilirubin Urine: NEGATIVE
Glucose, UA: NEGATIVE mg/dL
Hgb urine dipstick: NEGATIVE
Ketones, ur: 5 mg/dL — AB
Leukocytes,Ua: NEGATIVE
Nitrite: NEGATIVE
Protein, ur: NEGATIVE mg/dL
Specific Gravity, Urine: 1.018 (ref 1.005–1.030)
pH: 5 (ref 5.0–8.0)

## 2024-09-05 LAB — APTT: aPTT: 26 s (ref 24–36)

## 2024-09-05 LAB — AMMONIA: Ammonia: <13 umol/L (ref 9–35)

## 2024-09-05 LAB — PROTIME-INR
INR: 0.9 (ref 0.8–1.2)
Prothrombin Time: 13.1 s (ref 11.4–15.2)

## 2024-09-05 LAB — RESP PANEL BY RT-PCR (RSV, FLU A&B, COVID)  RVPGX2
Influenza A by PCR: NEGATIVE
Influenza B by PCR: NEGATIVE
Resp Syncytial Virus by PCR: NEGATIVE
SARS Coronavirus 2 by RT PCR: NEGATIVE

## 2024-09-05 LAB — CBG MONITORING, ED: Glucose-Capillary: 167 mg/dL — ABNORMAL HIGH (ref 70–99)

## 2024-09-05 LAB — TROPONIN T, HIGH SENSITIVITY: Troponin T High Sensitivity: 16 ng/L (ref 0–19)

## 2024-09-05 MED ORDER — ENOXAPARIN SODIUM 40 MG/0.4ML IJ SOSY
40.0000 mg | PREFILLED_SYRINGE | INTRAMUSCULAR | Status: AC
  Administered 2024-09-05 – 2024-09-06 (×2): 40 mg via SUBCUTANEOUS
  Filled 2024-09-05 (×2): qty 0.4

## 2024-09-05 MED ORDER — DONEPEZIL HCL 23 MG PO TABS
23.0000 mg | ORAL_TABLET | Freq: Every day | ORAL | Status: AC
  Administered 2024-09-06: 23 mg via ORAL
  Filled 2024-09-05 (×3): qty 1

## 2024-09-05 MED ORDER — METOPROLOL SUCCINATE ER 25 MG PO TB24
25.0000 mg | ORAL_TABLET | Freq: Every day | ORAL | Status: AC
  Administered 2024-09-06: 25 mg via ORAL
  Filled 2024-09-05: qty 1

## 2024-09-05 MED ORDER — SIMVASTATIN 20 MG PO TABS
40.0000 mg | ORAL_TABLET | Freq: Every day | ORAL | Status: AC
  Administered 2024-09-06 (×2): 40 mg via ORAL
  Filled 2024-09-05 (×2): qty 2

## 2024-09-05 MED ORDER — ONDANSETRON HCL 4 MG/2ML IJ SOLN: 4.0000 mg | Freq: Four times a day (QID) | INTRAMUSCULAR | Status: AC | PRN

## 2024-09-05 MED ORDER — NAPROXEN 250 MG PO TABS: 500.0000 mg | ORAL_TABLET | Freq: Every day | ORAL | Status: AC | PRN

## 2024-09-05 MED ORDER — ACETAMINOPHEN 650 MG RE SUPP: 650.0000 mg | Freq: Four times a day (QID) | RECTAL | Status: AC | PRN

## 2024-09-05 MED ORDER — FAMOTIDINE 20 MG PO TABS: 10.0000 mg | ORAL_TABLET | Freq: Every day | ORAL | Status: AC | PRN

## 2024-09-05 MED ORDER — ALBUTEROL SULFATE (2.5 MG/3ML) 0.083% IN NEBU: 2.5000 mg | INHALATION_SOLUTION | Freq: Four times a day (QID) | RESPIRATORY_TRACT | Status: AC | PRN

## 2024-09-05 MED ORDER — ONDANSETRON HCL 4 MG PO TABS: 4.0000 mg | ORAL_TABLET | Freq: Four times a day (QID) | ORAL | Status: AC | PRN

## 2024-09-05 MED ORDER — ASPIRIN 81 MG PO TBEC
81.0000 mg | DELAYED_RELEASE_TABLET | Freq: Every day | ORAL | Status: AC
  Administered 2024-09-06 (×2): 81 mg via ORAL
  Filled 2024-09-05 (×2): qty 1

## 2024-09-05 MED ORDER — SODIUM CHLORIDE 0.9 % IV SOLN: INTRAVENOUS | Status: AC

## 2024-09-05 MED ORDER — SODIUM CHLORIDE 0.9% FLUSH
3.0000 mL | Freq: Two times a day (BID) | INTRAVENOUS | Status: AC
  Administered 2024-09-05 – 2024-09-06 (×2): 3 mL via INTRAVENOUS

## 2024-09-05 MED ORDER — ACETAMINOPHEN 325 MG PO TABS
650.0000 mg | ORAL_TABLET | Freq: Four times a day (QID) | ORAL | Status: AC | PRN
  Administered 2024-09-06: 650 mg via ORAL
  Filled 2024-09-05: qty 2

## 2024-09-05 MED ORDER — NYSTATIN 100000 UNIT/GM EX POWD
1.0000 | Freq: Every day | CUTANEOUS | Status: AC
  Administered 2024-09-06: 1 via TOPICAL
  Filled 2024-09-05: qty 15

## 2024-09-05 NOTE — ED Triage Notes (Signed)
 Pt arrived from [facility]. Pt is not on memory care unit but has mild confusion at baseline. Pt reporting headache that started this morning and has gotten worse throughout the day. Pt is usually able to walk/talk. Pupils unequal upon assessment. EMS reported negative stroke scree. Pt having generalized weakness. A/Ox2 at this time.

## 2024-09-05 NOTE — H&P (Signed)
 " History and Physical    Patient: Cynthia [name] FMW:[id number] DOB: 1946-06-25 DOA: 09/05/2024 DOS: the patient was seen and examined on 09/05/2024 PCP: Larnell Hamilton, MD  Patient coming from: Via EMS  Chief Complaint:  Chief Complaint  Patient presents with   Altered Mental Status   HPI: Cynthia [name] is a 79 y.o. female with medical history significant of hypertension, hyperlipidemia, syncope, diabetes mellitus type 2, dementia, and obesity presented after being noted to be acutely altered.  History is obtained mostly from review of records due to patient's history of dementia.  Staff reported that the patient had been complaining of a headache and when they went to go sit her up she had a syncopal episode.  After the syncopal episode was also noted to have vomited and did not seem to be her norm for which EMS was.  It was reported that patient is normally alert and oriented only to self.  EMS noted that her pupils appeared to be different sizes and she was globally weak.  At baseline patient is able to ambulate with assistance.  The patient denies any feelings of being unwell.  On admission into the emergency department patient was noted to be afebrile with blood pressures elevated up to 156/73, and all other vital signs maintained.  CT scan of the head did not note any acute abnormality labs noted hemoglobin 11, platelets 121, glucose 160, ammonia level less than 13, and high-sensitivity troponin 16.  Chest x-ray noted mild cardiomegaly without CHF.  MRI of the brain did not note any significant abnormality.  Urinalysis did not show any significant signs for infection. Review of Systems: unable to review all systems due to the inability of the patient to answer questions. Past Medical History:  Diagnosis Date   Anxiety disorder    Asthmatic bronchitis    Colonic polyp    Degenerative joint disease    Diabetes mellitus    Dizziness    Headache(784.0)    Hypercholesteremia    Hypertension     Lumbar back pain    Memory changes    Morbid obesity (HCC)    Syncope    recurrent   Syncope    Venous insufficiency    Past Surgical History:  Procedure Laterality Date   CARPAL TUNNEL RELEASE     CATARACT EXTRACTION  2016   COLONOSCOPY     DILATION AND CURETTAGE OF UTERUS     LUMBAR LAMINECTOMY  1997   Dr. Lang   PELVIC ABCESS DRAINAGE  1970   Dr. EMERSON Finn   POLYPECTOMY     Social History:  reports that she quit smoking about 46 years ago. Her smoking use included cigarettes. She started smoking about 52 years ago. She has a 2.4 pack-year smoking history. She has never used smokeless tobacco. She reports that she does not drink alcohol and does not use drugs.  Allergies[1]  Family History  Problem Relation Age of Onset   Heart disease Mother    Diabetes Mother    Heart attack Mother        deceased 46   Hypertension Father    Heart disease Father    Heart attack Father        prior to 45   Diabetes Brother    Heart disease Brother    Diabetes Brother    Stroke Brother    Hypertension Brother    Heart failure Brother    Diabetes Maternal Grandmother    Diabetes Paternal  Grandmother    Diabetes Other        sibling    Prior to Admission medications  Medication Sig Start Date End Date Taking? Authorizing Provider  aspirin  EC 81 MG tablet Take 81 mg by mouth at bedtime.    [provider]  Calcium Carbonate Antacid (TUMS E-X PO) Take 1 tablet by mouth daily as needed (acid reflux).    [provider]  cholecalciferol (VITAMIN D ) 1000 UNITS tablet Take 2,000 Units by mouth daily.     [provider]  donepezil  (ARICEPT ) 23 MG TABS tablet Take 23 mg by mouth at bedtime.    [provider]  famotidine  (PEPCID ) 10 MG tablet Take 10 mg by mouth as needed.    [provider]  ferrous sulfate 325 (65 FE) MG tablet Take 325 mg by mouth daily with breakfast.    [provider]  hydrochlorothiazide  (HYDRODIURIL )  25 MG tablet Take 25 mg by mouth daily.    [provider]  icosapent  Ethyl (VASCEPA ) 1 g capsule Take 2 capsules (2 g total) by mouth 2 (two) times daily. 01/25/22   Hobart Powell BRAVO, MD  lisinopril  (ZESTRIL ) 40 MG tablet Take 40 mg by mouth daily. 10/04/19   [provider]  memantine (NAMENDA) 5 MG tablet Take 5 mg by mouth 2 (two) times daily. 08/29/24   [provider]  metFORMIN  (GLUCOPHAGE ) 1000 MG tablet Take 1,000 mg by mouth 2 (two) times daily with a meal.    [provider]  metoprolol  succinate (TOPROL  XL) 25 MG 24 hr tablet Take 1 tablet (25 mg total) by mouth daily. 04/05/24 04/05/25  Sigdel, Santosh, MD  metoprolol  tartrate (LOPRESSOR ) 100 MG tablet Take 1 tablet (100 mg total) by mouth once for 1 dose. Take 90-120 minutes prior to scan. 12/21/21 12/21/21  Hobart Powell BRAVO, MD  Multiple Vitamins-Minerals (MULTIPLE VITAMINS/WOMENS PO) Take 1 tablet by mouth daily.    [provider]  naproxen  sodium (ANAPROX ) 220 MG tablet Take 440 mg by mouth daily as needed (back pain).     [provider]  nitrofurantoin, macrocrystal-monohydrate, (MACROBID) 100 MG capsule Take 100 mg by mouth 2 (two) times daily. 09/03/24   [provider]  nystatin  (MYCOSTATIN /NYSTOP ) powder Apply 1 Application topically. 08/29/24   [provider]  Potassium 99 MG TABS Take 1 tablet by mouth daily.    [provider]  sertraline (ZOLOFT) 25 MG tablet Take 25 mg by mouth daily. 08/19/24   [provider]  simvastatin  (ZOCOR ) 40 MG tablet Take 1 tablet (40 mg total) by mouth at bedtime. 06/07/16   Christi Glendia HERO, MD  vitamin B-12 (CYANOCOBALAMIN) 1000 MCG tablet Take 1,000 mcg by mouth daily.    [provider]    Physical Exam: Vitals:   09/05/24 0453 09/05/24 0735 09/05/24 0739 09/05/24 0915  BP: (!) 167/43 (!) 154/59  (!) 156/73  Pulse: 73 69  74  Resp: 18 11  (!) 21  Temp: 98 F (36.7 C)  97.8 F (36.6 C)    TempSrc: Temporal  Oral   SpO2: 96% 100%  100%     Constitutional: Elderly female currently in no acute distress Eyes: Left pupil bigger in size than the right pupil ENMT: Mucous membranes are moist. Posterior pharynx clear of any exudate or lesions.Normal dentition.  Neck: normal, supple,  Respiratory: clear to auscultation bilaterally, no wheezing, no crackles. Normal respiratory effort. No accessory muscle use.  Cardiovascular: Regular rate and rhythm,  no murmurs / rubs / gallops. No extremity edema. 2+ pedal pulses. No carotid bruits.  Abdomen: no tenderness, no masses palpated. Bowel sounds positive.  Musculoskeletal: no clubbing / cyanosis. No joint deformity upper and lower extremities. Good ROM, no contractures. Normal muscle tone.  Skin: no rashes, lesions, ulcers. No induration Neurologic: CN 2-12 grossly intact.  Patient able to move all extremities. Psychiatric: Patient confused.  Alert and oriented to self, but not place and time.  Data Reviewed:   reviewed labs, imaging, and pertinent records as documented.  Assessment and Plan:  Acute encephalopathy Patient presents after complaining of headache, syncopal episode after being sat up with vomiting episode, and report of unequal pupils.  Labs did not note any acute abnormality.  There are no signs of a stroke noted on MRI of the brain.  Chest x-ray was otherwise clear and urinalysis did not note any signs for infection.  Patient has had prior evaluations and been seen by neurology and had negative EEGs in the past. - Admit to a telemetry bed - Delirium precautions - Check UDS - Check EEG - Gentle IV fluids 75 mL/h for 1 L  Dementia Patient has poor recent memory at baseline and is usually alert and oriented only to self and able to recognize family per review of records - Continue Aricept  and Namenda  Syncope Patient history of syncope related with orthostatic hypotension. - Up with assistance  Essential  hypertension Blood pressures were initially elevated up to 167/43.   - Continue metoprolol  tolerated  Controlled diabetes mellitus type 2, without long-term use of insulin  Glucose noted to be 160.  Last hemoglobin A1c 5.8 when checked 04/2024. - Hold metformin   Pancytopenia Acute.  Hemoglobin noted to be 11 and platelets 121.  No reports of bleeding. - Recheck CBC tomorrow morning.  Hyperlipidemia - Continue atorvastatin  Obesity, class II BMI 36.21 kg/m  DVT prophylaxis: Lovenox , but monitor platelet count Advance Care Planning:   Code Status: Full Code    Consults: None  Family Communication: Unable to speak with family over the phone Severity of Illness: The appropriate patient status for this patient is OBSERVATION. Observation status is judged to be reasonable and necessary in order to provide the required intensity of service to ensure the patient's safety. The patient's presenting symptoms, physical exam findings, and initial radiographic and laboratory data in the context of their medical condition is felt to place them at decreased risk for further clinical deterioration. Furthermore, it is anticipated that the patient will be medically stable for discharge from the hospital within 2 midnights of admission.   Author: Maximino DELENA Sharps, MD 09/05/2024 10:32 AM  For on call review [email or web address].      [1] No Known Allergies  "

## 2024-09-05 NOTE — ED Notes (Signed)
 Pt to CT

## 2024-09-05 NOTE — ED Notes (Signed)
 Patient emergency contact, Alm notified of patient admission.

## 2024-09-05 NOTE — ED Notes (Signed)
 Pt pulling at monitoring equipment. Pt redirected

## 2024-09-05 NOTE — ED Notes (Signed)
 Fall precautions in place (fall risk band, bed alarm, non-slip socks). Warm blankets given to pt.

## 2024-09-05 NOTE — ED Notes (Signed)
 Pt eating dinner

## 2024-09-05 NOTE — ED Provider Notes (Signed)
 " Cynthia [name] EMERGENCY DEPARTMENT AT [facility] Provider Note   CSN: [id number] Arrival date & time: 09/05/24  9572     Patient presents with: Altered Mental Status   Cynthia [name] is a 79 y.o. female.   Patient with history of diabetes, hypertension, dementia presents today from [facility] with complaints of altered mental status. According to EMS, patient has some dementia at baseline but has been more altered today. Did report a headache at some point as well. Apparently is able to walk at baseline. She is alert and oriented to self only for me, will follow commands, denies any current complaints. Attempted to contact family and facility without response.  Level 5 caveat -- dementia  The history is provided by the patient. No language interpreter was used.  Altered Mental Status      Prior to Admission medications  Medication Sig Start Date End Date Taking? Authorizing Provider  aspirin  EC 81 MG tablet Take 81 mg by mouth at bedtime.    [provider]  Calcium Carbonate Antacid (TUMS E-X PO) Take 1 tablet by mouth daily as needed (acid reflux).    [provider]  cholecalciferol (VITAMIN D ) 1000 UNITS tablet Take 2,000 Units by mouth daily.     [provider]  donepezil  (ARICEPT ) 23 MG TABS tablet Take 23 mg by mouth at bedtime.    [provider]  famotidine  (PEPCID ) 10 MG tablet Take 10 mg by mouth as needed.    [provider]  ferrous sulfate 325 (65 FE) MG tablet Take 325 mg by mouth daily with breakfast.    [provider]  hydrochlorothiazide  (HYDRODIURIL ) 25 MG tablet Take 25 mg by mouth daily.    [provider]  icosapent  Ethyl (VASCEPA ) 1 g capsule Take 2 capsules (2 g total) by mouth 2 (two) times daily. 01/25/22   Hobart Powell BRAVO, MD  lisinopril  (ZESTRIL ) 40 MG tablet Take 40 mg by mouth daily. 10/04/19   [provider]  metFORMIN  (GLUCOPHAGE ) 1000 MG tablet  Take 1,000 mg by mouth 2 (two) times daily with a meal.    [provider]  metoprolol  succinate (TOPROL  XL) 25 MG 24 hr tablet Take 1 tablet (25 mg total) by mouth daily. 04/05/24 04/05/25  Sigdel, Santosh, MD  metoprolol  tartrate (LOPRESSOR ) 100 MG tablet Take 1 tablet (100 mg total) by mouth once for 1 dose. Take 90-120 minutes prior to scan. 12/21/21 12/21/21  Hobart Powell BRAVO, MD  Multiple Vitamins-Minerals (MULTIPLE VITAMINS/WOMENS PO) Take 1 tablet by mouth daily.    [provider]  naproxen  sodium (ANAPROX ) 220 MG tablet Take 440 mg by mouth daily as needed (back pain).     [provider]  Potassium 99 MG TABS Take 1 tablet by mouth daily.    [provider]  simvastatin  (ZOCOR ) 40 MG tablet Take 1 tablet (40 mg total) by mouth at bedtime. 06/07/16   Christi Glendia HERO, MD  vitamin B-12 (CYANOCOBALAMIN) 1000 MCG tablet Take 1,000 mcg by mouth daily.    [provider]    Allergies: Patient has no known allergies.    Review of Systems  Unable to perform ROS: Dementia    Updated Vital Signs BP (!) 167/43 (BP Location: Right Arm)   Pulse 73   Temp 98 F (36.7 C) (Temporal)   Resp 18   SpO2 96%   Physical Exam Vitals and nursing note reviewed.  Constitutional:  General: She is not in acute distress.    Appearance: Normal appearance. She is normal weight. She is not ill-appearing, toxic-appearing or diaphoretic.  HENT:     Head: Normocephalic and atraumatic.  Neck:     Comments: No meningismus Cardiovascular:     Rate and Rhythm: Normal rate and regular rhythm.     Heart sounds: Normal heart sounds.  Pulmonary:     Effort: Pulmonary effort is normal. No respiratory distress.     Breath sounds: Normal breath sounds.  Abdominal:     General: Abdomen is flat.     Palpations: Abdomen is soft.     Tenderness: There is no abdominal tenderness.  Musculoskeletal:        General: Normal range of motion.     Cervical back: Normal range  of motion.     Right lower leg: No edema.     Left lower leg: No edema.  Skin:    General: Skin is warm and dry.  Neurological:     General: No focal deficit present.     Mental Status: She is alert.     Comments: Alert to self only, does follow commands. Slight left sided weakness arm, however appears this is chronic based on chart review. Anisocoria noted, left pupil non reactive 3mm, right eye reactive, looks like from chart review this is baseline as well. EOMs are intact bilaterally  No facial droop, no slurred speech. Sensation intact to the face  Psychiatric:        Mood and Affect: Mood normal.        Behavior: Behavior normal.     (all labs ordered are listed, but only abnormal results are displayed) Labs Reviewed  COMPREHENSIVE METABOLIC PANEL WITH GFR - Abnormal; Notable for the following components:      Result Value   Glucose, Bld 160 (*)    Total Protein 5.6 (*)    All other components within normal limits  CBC WITH DIFFERENTIAL/PLATELET - Abnormal; Notable for the following components:   RBC 3.56 (*)    Hemoglobin 11.0 (*)    HCT 33.1 (*)    Platelets 121 (*)    Neutro Abs 8.2 (*)    Lymphs Abs 0.2 (*)    All other components within normal limits  URINALYSIS, ROUTINE W REFLEX MICROSCOPIC - Abnormal; Notable for the following components:   Ketones, ur 5 (*)    All other components within normal limits  CBG MONITORING, ED - Abnormal; Notable for the following components:   Glucose-Capillary 167 (*)    All other components within normal limits  RESP PANEL BY RT-PCR (RSV, FLU A&B, COVID)  RVPGX2  APTT  PROTIME-INR  AMMONIA  TROPONIN T, HIGH SENSITIVITY    EKG: EKG Interpretation Date/Time:  Thursday September 05 2024 04:43:24 EST Ventricular Rate:  71 PR Interval:  148 QRS Duration:  79 QT Interval:  423 QTC Calculation: 460 R Axis:   80  Text Interpretation: Sinus rhythm Low voltage, precordial leads Confirmed by Jerral Meth [id number]) on 09/05/2024  5:23:43 AM  Radiology: CT Head Wo Contrast Result Date: 09/05/2024 EXAM: CT HEAD WITHOUT CONTRAST 09/05/2024 05:02:20 AM TECHNIQUE: CT of the head was performed without the administration of intravenous contrast. Automated exposure control, iterative reconstruction, and/or weight based adjustment of the mA/kV was utilized to reduce the radiation dose to as low as reasonably achievable. COMPARISON: Head CT from 07/19/2024. CLINICAL HISTORY: Headache, neuro deficit. FINDINGS: BRAIN AND VENTRICLES: No acute hemorrhage. No evidence of acute  infarct. Cerebral volume loss with moderate small vessel disease and atrophic ventriculomegaly. Mild cerebellar volume loss. Chronic perivascular spaces are again noted in the basal ganglia. Calcifications in the carotid siphons and distal vertebral arteries. No hyperdense vessel is seen. No extra-axial collection. No mass effect or midline shift. 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 Symmetric increased orbital fat, probably relating to obesity. Old lens replacements are again shown. SINUSES: Mild membrane thickening in the sinuses without fluid levels or mastoid effusion. SOFT TISSUES AND SKULL: No acute soft tissue abnormality. No skull fracture. IMPRESSION: 1. No acute intracranial CT findings. 2. Chronic changes. Stable exam. 3. 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 Electronically signed by: Francis Quam MD 09/05/2024 05:36 AM EST RP Workstation: [id number]     Procedures   Medications Ordered in the ED - No data to display                                  Medical Decision Making Amount and/or Complexity of Data Reviewed Labs: ordered. Radiology: ordered.   This patient is a 79 y.o. female who presents to the ED for concern of altered mental status, this involves an extensive number of treatment options, and is a complaint that carries with it a high risk of complications and  morbidity. The emergent differential diagnosis prior to evaluation includes, but is not limited to,  Drug-related, hypoxia, hyper/hypoglycemia, encephalopathy, sepsis, DKA/HHS, brain lesion, CVA, seizure, environmental, psychiatric   This is not an exhaustive differential.   Past Medical History / Co-morbidities / Social History:  has a past medical history of Anxiety disorder, Asthmatic bronchitis, Colonic polyp, Degenerative joint disease, Diabetes mellitus, Dizziness, Headache(784.0), Hypercholesteremia, Hypertension, Lumbar back pain, Memory changes, Morbid obesity (HCC), Syncope, Syncope, and Venous insufficiency.  Additional history: Chart reviewed. Pertinent results include: seen for syncope in 9/25, admitted for same. Does look like she had unequal pupils and mild left sided weakness at this visit as well. Also sounds like she has confusion/cognitive deficits at baseline  Physical Exam: Physical exam performed. The pertinent findings include:   Alert to self only, does follow commands. Slight left sided weakness arm, however appears this is chronic based on chart review. Anisocoria noted, left pupil non reactive 3mm, right eye reactive, looks like from chart review this is baseline as well. EOMs are intact bilaterally  No facial droop, no slurred speech. Sensation intact to the face  Lab Tests: I ordered, and personally interpreted labs.  The pertinent results include:  hgb 11, UA with 5 ketones, noninfectious. Troponin, RVP, ammonia pending   Imaging Studies: I ordered imaging studies including CT head, CXR, MRI brain. I independently visualized and interpreted imaging which showed   CT head: 1. No acute intracranial CT findings. 2. Chronic changes. Stable exam. 3. 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  CXR: 1. No acute findings. Low inspiration study. 2. Mild cardiomegaly without CHF.  I agree with the radiologist  interpretation.  MRI pending at shift change   Cardiac Monitoring:  The patient was maintained on a cardiac monitor.  My attending physician Dr. Jerral viewed and interpreted the cardiac monitored which showed an underlying rhythm of: sinus rhythm, no STEMI. I agree with this interpretation.   Disposition:  Patients RVP, troponin, MRI pending at shift change. Unclear specifics regarding story given patients dementia, seems to be baseline per chart review. Attempted to contact family and  patients facility without response overnight. Plan to try again during daytime hours. If abnormal findings on additional work-up or truly altered from baseline, may need admission.  Care handoff to Almarie Knee, PA-C at shift change. Please see their note for continued evaluation and disposition.   I discussed this case with my attending physician Dr. Jerral who cosigned this note including patient's presenting symptoms, physical exam, and planned diagnostics and interventions. Attending physician stated agreement with plan or made changes to plan which were implemented.    Final diagnoses:  None    ED Discharge Orders     None          Nora Lauraine DELENA DEVONNA 09/05/24 [id number]    Jerral Meth, MD 09/05/24 [phone]  "

## 2024-09-05 NOTE — Progress Notes (Signed)
 Routine EEG completed, results pending Neurology review and interpretation

## 2024-09-05 NOTE — ED Notes (Signed)
 Pt requested I call her family. I called her brother, the emergency contact on file. No answer, left a message.

## 2024-09-05 NOTE — ED Provider Notes (Signed)
 " Physical Exam  BP (!) 167/43 (BP Location: Right Arm)   Pulse 73   Temp 98 F (36.7 C) (Temporal)   Resp 18   SpO2 96%   Physical Exam Vitals and nursing note reviewed.  Constitutional:      General: She is not in acute distress.    Appearance: Normal appearance. She is not ill-appearing, toxic-appearing or diaphoretic.     Comments: Pleasantly confused.  HENT:     Head: Normocephalic and atraumatic.     Nose: Nose normal.     Mouth/Throat:     Mouth: Mucous membranes are moist.  Eyes:     General: No scleral icterus.       Right eye: No discharge.        Left eye: No discharge.     Extraocular Movements: Extraocular movements intact.     Conjunctiva/sclera: Conjunctivae normal.  Cardiovascular:     Rate and Rhythm: Normal rate and regular rhythm.     Pulses: Normal pulses.     Heart sounds: Normal heart sounds.  Pulmonary:     Effort: Pulmonary effort is normal. No respiratory distress.     Breath sounds: Normal breath sounds. No stridor. No wheezing, rhonchi or rales.  Abdominal:     General: Abdomen is flat. Bowel sounds are normal. There is no distension.     Palpations: Abdomen is soft.     Tenderness: There is no abdominal tenderness. There is no guarding.  Musculoskeletal:        General: No tenderness. Normal range of motion.     Cervical back: Normal range of motion and neck supple. No rigidity or tenderness.     Right lower leg: No edema.     Left lower leg: No edema.  Skin:    General: Skin is warm and dry.     Capillary Refill: Capillary refill takes less than 2 seconds.     Coloration: Skin is not jaundiced or pale.  Neurological:     Mental Status: She is alert.     Sensory: No sensory deficit.     Motor: Weakness present.     Comments: Alert to self and place, follows commands. Slight left sided arm weakness, however appears this is chronic based on chart review. Anisocoria noted, left pupil non reactive 3mm, right eye reactive, looks like from chart  review this is baseline as well. EOMs are intact bilaterally. Sensation intact bilaterally. No slurred speech, focal gaze, or facial droop.     Procedures  Procedures  ED Course / MDM    Medical Decision Making Amount and/or Complexity of Data Reviewed Labs: ordered. Radiology: ordered.  Risk Decision regarding hospitalization.   Signout from Sarah Smoot PA-C at shift change. Briefly, patient presents from [facility] facility with complaints of altered mental status. According to EMS, patient has some dementia at baseline but has been more altered today. Did report a headache at some point as well. Apparently is able to walk at baseline. Pertinent medical history of diabetes, hypertension, and dementia. Previous provider attempted to contact family and facility without response.    Plan: Brain MRI, Ammonia, Respiratory panel, and Troponin pending.    6:37 AM Reassessment performed. Patient appears comfortably laying in bed. She is not sure why she is here. She knows she is at [facility] in [place]. Unable to correctly name the President or current year. She denies any pain. When asked if she was experiencing any headaches, she states she always has  a headache. She denies any medical complaints at this time.   Labs and imaging personally reviewed and interpreted including: CBC, CMP, urinalysis, and coagulation studies are largely unremarkable.  Chest x-ray with no acute findings.  Head CT Noncon with no acute intracranial abnormalities.   Reviewed additional pertinent lab work and imaging with patient at bedside including: Respiratory panel negative.  Normal initial troponin.  Normal ammonia.  No acute infarct or other significant abnormalities seen on brain MRI. Consideration for encephalopathy versus worsening dementia.   Most current vital signs reviewed and are as follows: BP (!) 167/43 (BP Location: Right Arm)   Pulse 73   Temp 98 F (36.7 C) (Temporal)   Resp  18   SpO2 96%   Consultations Obtained:   I requested consultation with the hospitalist Beverlee MD),  and discussed lab and imaging findings as well as pertinent plan - they recommend: Admission to progressive bed.   Disposition: Admission to hospital for ongoing evaluation and management of altered mental status.   This note was produced using Electronics Engineer. While the provider has reviewed and verified all clinical information, transcription errors may remain.    Cynthia Almarie LABOR, PA-C 09/05/24 1517    Cynthia Meth, MD 09/05/24 2252  "

## 2024-09-05 NOTE — ED Notes (Signed)
 Patient stool occurrence. Peri-care performed. Pt changed into new brief/bed pad changed.

## 2024-09-06 DIAGNOSIS — G9341 Metabolic encephalopathy: Secondary | ICD-10-CM | POA: Diagnosis present

## 2024-09-06 LAB — COMPREHENSIVE METABOLIC PANEL WITH GFR
ALT: 35 U/L (ref 0–44)
AST: 40 U/L (ref 15–41)
Albumin: 3.4 g/dL — ABNORMAL LOW (ref 3.5–5.0)
Alkaline Phosphatase: 62 U/L (ref 38–126)
Anion gap: 11 (ref 5–15)
BUN: 13 mg/dL (ref 8–23)
CO2: 24 mmol/L (ref 22–32)
Calcium: 8.8 mg/dL — ABNORMAL LOW (ref 8.9–10.3)
Chloride: 99 mmol/L (ref 98–111)
Creatinine, Ser: 0.7 mg/dL (ref 0.44–1.00)
GFR, Estimated: >60 mL/min
Glucose, Bld: 254 mg/dL — ABNORMAL HIGH (ref 70–99)
Potassium: 3.7 mmol/L (ref 3.5–5.1)
Sodium: 134 mmol/L — ABNORMAL LOW (ref 135–145)
Total Bilirubin: 0.3 mg/dL (ref 0.0–1.2)
Total Protein: 5.7 g/dL — ABNORMAL LOW (ref 6.5–8.1)

## 2024-09-06 LAB — CBC
HCT: 32.6 % — ABNORMAL LOW (ref 36.0–46.0)
Hemoglobin: 11.1 g/dL — ABNORMAL LOW (ref 12.0–15.0)
MCH: 31 pg (ref 26.0–34.0)
MCHC: 34 g/dL (ref 30.0–36.0)
MCV: 91.1 fL (ref 80.0–100.0)
Platelets: 141 10*3/uL — ABNORMAL LOW (ref 150–400)
RBC: 3.58 MIL/uL — ABNORMAL LOW (ref 3.87–5.11)
RDW: 13 % (ref 11.5–15.5)
WBC: 6.1 10*3/uL (ref 4.0–10.5)
nRBC: 0 % (ref 0.0–0.2)

## 2024-09-06 MED ORDER — HYDRALAZINE HCL 20 MG/ML IJ SOLN: 10.0000 mg | INTRAMUSCULAR | Status: AC | PRN

## 2024-09-06 MED ORDER — SODIUM CHLORIDE 0.9 % IV SOLN: INTRAVENOUS | Status: AC

## 2024-09-06 NOTE — Progress Notes (Signed)
 [facility] night cross cover note:   I added as needed IV hydralazine  for elevated blood pressure, with corresponding heart rates in the 50s to 60s.    Eva Pore, DO Hospitalist

## 2024-09-06 NOTE — Discharge Planning (Signed)
 Transition of Care [facility]) - Inpatient Brief Assessment   Patient Details  Name: Cynthia [name] MRN: [id number] Date of Birth: 1946-02-08  Transition of Care [facility]) CM/SW Contact:    Debarah Saunas, RN Phone Number: 09/06/2024, 8:59 AM   Clinical Narrative: Inpatient Care Management (ICM) has reviewed patient at bedside and no ICM needs have been identified at this time. We will continue to monitor patient advancement through interdisciplinary progression rounds. If new patient transition needs arise, please place a ICM consult.    Transition of Care Asessment: Insurance and Status: Insurance coverage has been reviewed Patient has primary care physician: Yes Adelfa, Glendia, MD) Home environment has been reviewed: From [facility] Prior level of function:: independent Prior/Current Home Services: No current home services Social Drivers of Health Review: SDOH reviewed no interventions necessary Readmission risk has been reviewed: No Transition of care needs: no transition of care needs at this time

## 2024-09-06 NOTE — Care Management Obs Status (Signed)
 MEDICARE OBSERVATION STATUS NOTIFICATION   Patient Details  Name: Cynthia [name] MRN: [id number] Date of Birth: June 07, 1946   Medicare Observation Status Notification Given:  Yes    Debarah Saunas, RN 09/06/2024, 8:53 AM

## 2024-09-06 NOTE — Progress Notes (Signed)
 " Progress Note   Patient: Cynthia [name] FMW:[id number] DOB: Oct 24, 1945 DOA: 09/05/2024     0 DOS: the patient was seen and examined on 09/06/2024   Brief hospital course: Cynthia [name] is a 79 y.o. female with medical history significant of hypertension, hyperlipidemia, syncope, diabetes mellitus type 2, dementia, and obesity presented after being noted to be acutely altered. On admission into the emergency department patient was noted to be afebrile with blood pressures elevated up to 156/73, and all other vital signs maintained.  CT scan of the head did not note any acute abnormality labs noted hemoglobin 11, platelets 121, glucose 160, ammonia level less than 13, and high-sensitivity troponin 16.  Chest x-ray noted mild cardiomegaly without CHF.  MRI of the brain did not note any significant abnormality.  Urinalysis did not show any significant signs for infection.  Patient was subsequently admitted for further management  Assessment and Plan:  Acute metabolic encephalopathy Patient presents after complaining of headache, syncopal episode after being sat up with vomiting episode, and report of unequal pupils.  Labs did not note any acute abnormality.  There are no signs of a stroke noted on MRI of the brain.  Chest x-ray was otherwise clear and urinalysis did not note any signs for infection. Patient slightly dehydrated EEG did not show any epileptiform waves. Urine toxicology was normal Continue IV fluid with Continue delirium precaution   Dementia According to patient's brother she is able to engage in some conversation however has significant dementia and unable to remember a lot of stuff. - Continue Aricept  and Namenda   Syncope Unable to obtain orthostatic vitals at this time Continue IV fluid for now   Essential hypertension Blood pressures were initially elevated up to 167/43.   Continue metoprolol    Controlled diabetes mellitus type 2, without long-term use of insulin  Glucose  noted to be 160.  Last hemoglobin A1c 5.8 when checked 04/2024. - Hold metformin    Pancytopenia Acute.  Hemoglobin noted to be 11 and platelets 121.  No reports of bleeding. - Recheck CBC tomorrow morning.   Hyperlipidemia - Continue atorvastatin   Obesity, class II BMI 36.21 kg/m   DVT prophylaxis: Lovenox , but monitor platelet count Advance Care Planning:   Code Status: I discussed CODE STATUS with patient's brother at bedside who agreed that patient is a DNI DNR   Consults: None   Family Communication: Discussed with patient's brother who makes decision for her at bedside.  Patient has no husband and no children  Subjective:  Seen and examined at bedside this morning Still altered and unable to contribute to any subjective information  Physical Exam: Constitutional: Elderly female currently in no acute distress ENMT: Mucous membranes are moist. Posterior pharynx clear of any exudate or lesions.Normal dentition.  Neck: normal, supple,  Respiratory: clear to auscultation bilaterally, no wheezing, no crackles. Normal respiratory effort. No accessory muscle use.  Cardiovascular: Regular rate and rhythm, no murmurs Abdomen: no tenderness, no masses palpated.  Musculoskeletal: no clubbing / cyanosis.  Skin: no rashes, lesions, ulcers. No induration Neurologic: Altered Psychiatric: Patient confused.   Vitals:   09/06/24 1230 09/06/24 1300 09/06/24 1403 09/06/24 1636  BP: (!) 128/108 (!) 154/59  (!) 163/60  Pulse: 64 (!) 57  65  Resp: 15 14  18   Temp:   98.5 F (36.9 C)   TempSrc:   Oral   SpO2: 97% 100%  96%    Data Reviewed: I have personally reviewed patient chest x-ray that did  not show any infiltrate however showed some cardiomegaly    Latest Ref Rng & Units 09/06/2024    5:17 PM 09/05/2024    5:16 AM 04/04/2024    2:08 AM  CBC  WBC 4.0 - 10.5 K/uL 6.1  8.8  7.8   Hemoglobin 12.0 - 15.0 g/dL 88.8  88.9  87.9   Hematocrit 36.0 - 46.0 % 32.6  33.1  35.6   Platelets  150 - 400 K/uL 141  121  186        Latest Ref Rng & Units 09/06/2024    5:17 PM 09/05/2024    5:16 AM 04/04/2024    2:08 AM  BMP  Glucose 70 - 99 mg/dL 745  839  882   BUN 8 - 23 mg/dL 13  14  11    Creatinine 0.44 - 1.00 mg/dL 9.29  9.37  9.19   Sodium 135 - 145 mmol/L 134  136  136   Potassium 3.5 - 5.1 mmol/L 3.7  3.6  3.6   Chloride 98 - 111 mmol/L 99  101  98   CO2 22 - 32 mmol/L 24  24  20    Calcium 8.9 - 10.3 mg/dL 8.8  8.9  9.4      Author: Drue ONEIDA Potter, MD 09/06/2024 6:35 PM  For on call review [email or web address].  "

## 2024-09-06 NOTE — Procedures (Signed)
 Patient Name: Cynthia [name]  MRN: [id number]  Epilepsy Attending: Arlin MALVA Krebs  Referring Physician/Provider: Claudene Maximino LABOR, MD  Date: 09/05/2024 Duration: 27.15 mins  Patient history: 79yo F with ams. EEG to evaluate for seizure  Level of alertness: Awake  AEDs during EEG study: None  Technical aspects: This EEG study was done with scalp electrodes positioned according to the 10-20 International system of electrode placement. Electrical activity was reviewed with band pass filter of 1-70Hz , sensitivity of 7 uV/mm, display speed of 35mm/sec with a 60Hz  notched filter applied as appropriate. EEG data were recorded continuously and digitally stored.  Video monitoring was available and reviewed as appropriate.  Description: The posterior dominant rhythm consists of 8-9 Hz activity of moderate voltage (25-35 uV) seen predominantly in posterior head regions, symmetric and reactive to eye opening and eye closing. Hyperventilation and photic stimulation were not performed.     IMPRESSION: This study is within normal limits. No seizures or epileptiform discharges were seen throughout the recording.  A normal interictal EEG does not exclude the diagnosis of epilepsy.   Cynthia [name]
